# Patient Record
Sex: Female | Born: 1990 | Race: Black or African American | Hispanic: No | Marital: Single | Smoking: Never smoker
Health system: Southern US, Community
[De-identification: ages and names within clinical notes are randomized; demographics above are authoritative.]

## PROBLEM LIST (undated history)

## (undated) DIAGNOSIS — F419 Anxiety disorder, unspecified: Secondary | ICD-10-CM

---

## 2021-05-13 ENCOUNTER — Other Ambulatory Visit (HOSPITAL_COMMUNITY): Payer: Self-pay

## 2021-05-13 ENCOUNTER — Telehealth: Payer: 59 | Admitting: Physician Assistant

## 2021-05-13 DIAGNOSIS — H04203 Unspecified epiphora, bilateral lacrimal glands: Secondary | ICD-10-CM | POA: Diagnosis not present

## 2021-05-13 MED ORDER — AZELASTINE HCL 0.05 % OP SOLN
1.0000 [drp] | Freq: Two times a day (BID) | OPHTHALMIC | 0 refills | Status: DC
  Filled 2021-05-13: qty 6, 28d supply, fill #0

## 2021-05-13 NOTE — Progress Notes (Signed)
I have spent 5 minutes in review of e-visit questionnaire, review and updating patient chart, medical decision making and response to patient.   Leean Amezcua Cody Guida Asman, PA-C    

## 2021-05-13 NOTE — Progress Notes (Signed)
E visit for Allergic Rhinitis We are sorry that you are not feeling well.  Here is how we plan to help!  Based on what you have shared with me it looks like you have Allergic induced tearing or allergic conjunctivitis.  I recommend the following over the counter treatments: You should take a daily dose of antihistamine and Clarinex 5 mg take 1 tablet daily (NOTE: Do not take if pregnant or breastfeeding) I have also sent in a script for Optivar eye solution to use as directed. If no substantial improvement within 3-4 days of using this, you need an in-person evaluation with a good eye examination.    HOME CARE:  You can use an over-the-counter saline nasal spray as needed Avoid areas where there is heavy dust, mites, or molds Stay indoors on windy days during the pollen season Keep windows closed in home, at least in bedroom; use air conditioner. Use high-efficiency house air filter Keep windows closed in car, turn AC on re-circulate Avoid playing out with dog during pollen season  GET HELP RIGHT AWAY IF:  If your symptoms do not improve within 10 days You become short of breath You develop yellow or green discharge from your nose for over 3 days You have coughing fits  MAKE SURE YOU:  Understand these instructions Will watch your condition Will get help right away if you are not doing well or get worse  Thank you for choosing an e-visit. Your e-visit answers were reviewed by a board certified advanced clinical practitioner to complete your personal care plan. Depending upon the condition, your plan could have included both over the counter or prescription medications. Please review your pharmacy choice. Be sure that the pharmacy you have chosen is open so that you can pick up your prescription now.  If there is a problem you may message your provider in MyChart to have the prescription routed to another pharmacy. Your safety is important to [place]. If you have drug allergies check  your prescription carefully.  For the next 24 hours, you can use MyChart to ask questions about today's visit, request a non-urgent call back, or ask for a work or school excuse from your e-visit provider. You will get an email in the next two days asking about your experience. I hope that your e-visit has been valuable and will speed your recovery.

## 2021-05-14 ENCOUNTER — Other Ambulatory Visit (HOSPITAL_COMMUNITY): Payer: Self-pay

## 2021-05-15 ENCOUNTER — Other Ambulatory Visit (HOSPITAL_COMMUNITY): Payer: Self-pay

## 2021-06-24 ENCOUNTER — Ambulatory Visit: Payer: Self-pay | Admitting: Medical

## 2021-07-23 ENCOUNTER — Other Ambulatory Visit (HOSPITAL_COMMUNITY): Payer: Self-pay

## 2021-07-23 ENCOUNTER — Encounter: Payer: Self-pay | Admitting: Nurse Practitioner

## 2021-07-23 ENCOUNTER — Ambulatory Visit: Payer: 59 | Admitting: Nurse Practitioner

## 2021-07-23 ENCOUNTER — Other Ambulatory Visit: Payer: Self-pay

## 2021-07-23 VITALS — BP 112/78 | HR 91 | Temp 97.6°F | Ht 67.75 in | Wt 246.6 lb

## 2021-07-23 DIAGNOSIS — E669 Obesity, unspecified: Secondary | ICD-10-CM | POA: Insufficient documentation

## 2021-07-23 DIAGNOSIS — F321 Major depressive disorder, single episode, moderate: Secondary | ICD-10-CM | POA: Diagnosis not present

## 2021-07-23 DIAGNOSIS — F339 Major depressive disorder, recurrent, unspecified: Secondary | ICD-10-CM | POA: Insufficient documentation

## 2021-07-23 DIAGNOSIS — Z6837 Body mass index (BMI) 37.0-37.9, adult: Secondary | ICD-10-CM | POA: Diagnosis not present

## 2021-07-23 DIAGNOSIS — H04123 Dry eye syndrome of bilateral lacrimal glands: Secondary | ICD-10-CM

## 2021-07-23 DIAGNOSIS — F5081 Binge eating disorder: Secondary | ICD-10-CM | POA: Diagnosis not present

## 2021-07-23 DIAGNOSIS — E663 Overweight: Secondary | ICD-10-CM | POA: Insufficient documentation

## 2021-07-23 MED ORDER — SYSTANE BALANCE 0.6 % OP SOLN: 1.0000 [drp] | OPHTHALMIC | Status: AC | PRN

## 2021-07-23 MED ORDER — BUPROPION HCL 75 MG PO TABS
75.0000 mg | ORAL_TABLET | Freq: Two times a day (BID) | ORAL | 5 refills | Status: DC
  Filled 2021-07-23: qty 60, 30d supply, fill #0
  Filled 2021-08-20: qty 60, 30d supply, fill #1

## 2021-07-23 NOTE — Patient Instructions (Addendum)
Thank you for choosing [facility]  Go to lab for blood day  Sign medical release form to get your records from previous GYN  You will be contacted to schedule appt with psychologist, weight management clinic and ophthalmology.  [email or web address]"> [email or web address]"> [email or web address]/"> http://point-[email or web address]-care.[email or web address]">  Managing Depression, Adult Depression is a mental health condition that affects your thoughts, feelings, and actions. Being diagnosed with depression can bring you relief if you did not know why you have felt or behaved a certain way. It could also leave you feeling overwhelmed with uncertainty about your future. Preparing yourself tomanage your symptoms can help you feel more positive about your future. How to manage lifestyle changes Managing stress  Stress is your body's reaction to life changes and events, both good and bad. Stress can add to your feelings of depression. Learning to manage your stresscan help lessen your feelings of depression. Try some of the following approaches to reducing your stress (stress reduction techniques): Listen to music that you enjoy and that inspires you. Try using a meditation app or take a meditation class. Develop a practice that helps you connect with your spiritual self. Walk in nature, pray, or go to a place of worship. Do some deep breathing. To do this, inhale slowly through your nose. Pause at the top of your inhale for a few seconds and then exhale slowly, letting your muscles relax. Practice yoga to help relax and work your muscles. Choose a stress reduction technique that suits your lifestyle and personality. These techniques take time and practice to develop. Set aside 5-15 minutes a day to do them. Therapists can offer training in these techniques. Other things you can do to manage stress include: Keeping a stress diary. Knowing  your limits and saying no when you think something is too much. Paying attention to how you react to certain situations. You may not be able to control everything, but you can change your reaction. Adding humor to your life by watching funny films or TV shows. Making time for activities that you enjoy and that relax you.  Medicines Medicines, such as antidepressants, are often a part of treatment for depression. Talk with your pharmacist or health care provider about all the medicines, supplements, and herbal products that you take, their possible side effects, and what medicines and other products are safe to take together. Make sure to report any side effects you may have to your health care provider. Relationships Your health care provider may suggest family therapy, couples therapy, orindividual therapy as part of your treatment. How to recognize changes Everyone responds differently to treatment for depression. As you recover from depression, you may start to: Have more interest in doing activities. Feel less hopeless. Have more energy. Overeat less often, or have a better appetite. Have better mental focus. It is important to recognize if your depression is not getting better or is getting worse. The symptoms you had in the beginning may return, such as: Tiredness (fatigue) or low energy. Eating too much or too little. Sleeping too much or too little. Feeling restless, agitated, or hopeless. Trouble focusing or making decisions. Unexplained physical complaints. Feeling irritable, angry, or aggressive. If you or your family members notice these symptoms coming back, let yourhealth care provider know right away. Follow these instructions at home: Activity  Try to get some form of exercise each day, such as walking, biking, swimming, or lifting weights. Practice stress reduction techniques. Engage your mind by taking a class or doing some volunteer work.  Lifestyle Get the right  amount and quality of sleep. Cut down on using caffeine, tobacco, alcohol, and other potentially harmful substances. Eat a healthy diet that includes plenty of vegetables, fruits, whole grains, low-fat dairy products, and lean protein. Do not eat a lot of foods that are high in solid fats, added sugars, or salt (sodium). General instructions Take over-the-counter and prescription medicines only as told by your health care provider. Keep all follow-up visits as told by your health care provider. This is important. Where to find support Talking to others  Friends and family members can be sources of support and guidance. Talk to trusted friends or family members about your condition. Explain your symptoms to them, and let them know that you are working with a health care provider to treat your depression. Tell friends and family members how they also can behelpful. Finances Find appropriate mental health providers that fit with your financial situation. Talk with your health care provider about options to get reduced prices on your medicines. Where to find more information You can find support in your area from: Anxiety and Depression Association of America (ADAA): www.adaa.org Mental Health America: www.mentalhealthamerica.net The [employer] on Mental Illness: www.nami.org Contact a health care provider if: You stop taking your antidepressant medicines, and you have any of these symptoms: Nausea. Headache. Light-headedness. Chills and body aches. Not being able to sleep (insomnia). You or your friends and family think your depression is getting worse. Get help right away if: You have thoughts of hurting yourself or others. If you ever feel like you may hurt yourself or others, or have thoughts about taking your own life, get help right away. Go to your nearest emergency department or: Call your local emergency services (911 in the [place] Call a suicide crisis helpline, such as the  National Suicide Prevention Lifeline at 902-578-1023. This is open 24 hours a day in the [place] Text the Crisis Text Line at 367-522-4911 (in the [place] Summary If you are diagnosed with depression, preparing yourself to manage your symptoms is a good way to feel positive about your future. Work with your health care provider on a management plan that includes stress reduction techniques, medicines (if applicable), therapy, and healthy lifestyle habits. Keep talking with your health care provider about how your treatment is working. If you have thoughts about taking your own life, call a suicide crisis helpline or text a crisis text line. This information is not intended to replace advice given to you by your health care provider. Make sure you discuss any questions you have with your healthcare provider. Document Revised: 09/26/2019 Document Reviewed: 09/26/2019 Elsevier Patient Education  2022 [employer].

## 2021-07-23 NOTE — Assessment & Plan Note (Signed)
Weight loss goal:60lbs Lowest weight:155lbs at 30yrs old Current weight is heaviest. To B12 injections and phentermine 7yrs ago and lost 20lbs. Exercise: cardio exercise daily Diet: has difficulty maintaining healthy diet and small portions

## 2021-07-23 NOTE — Assessment & Plan Note (Signed)
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 Reports stress eating especially at night. Current support system: her parents No previous medication or therapy sessions. No previous IVC, no suicide attept, no current SI/HI, no ETOH use, no illicit drug use/  Agreed to stating wellbutrin and referral to psychologist. F/up in 57month

## 2021-07-23 NOTE — Progress Notes (Signed)
Y   Subjective:  Patient ID: Maria Wiggins, female    DOB: Dec 25, 1990  Age: 30 y.o. MRN: [id number]  CC: Establish Care (New patient/Pt would like to discuss weight loss options and dry eyes. Pt states since moving here from [place] she has experienced dry eye and she has been using drops with no relief. )  Eye Problem  Both eyes are affected. This is a new problem. The current episode started 1 to 4 weeks ago. The problem occurs constantly. The problem has been unchanged. The injury mechanism is unknown. There is No known exposure to pink eye. She Does not wear contacts. Associated symptoms include a foreign body sensation and itching. Pertinent negatives include no blurred vision, eye discharge, double vision, eye redness, fever, nausea, photophobia, recent URI or vomiting. She has tried eye drops for the symptoms. The treatment provided mild relief.   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 Reports stress eating especially at night. Current support system: her parents No previous medication or therapy sessions. No previous IVC, no suicide attept, no current SI/HI, no ETOH use, no illicit drug use/  Agreed to stating wellbutrin and referral to psychologist. F/up in 2month  Depression screen [facility] 2/9 07/23/2021  Decreased Interest 2  Down, Depressed, Hopeless 1  PHQ - 2 Score 3  Altered sleeping 2  Tired, decreased energy 2  Change in appetite 3  Feeling bad or failure about yourself  2  Trouble concentrating 1  Moving slowly or fidgety/restless 2  Suicidal thoughts 0  PHQ-9 Score 15  Difficult doing work/chores Somewhat difficult    GAD 7 : Generalized Anxiety Score 07/23/2021  Nervous, Anxious, on Edge 2  Control/stop worrying 3  Worry too much - different things 3  Trouble relaxing 2  Restless 2  Easily annoyed or irritable 2   Afraid - awful might happen 2  Total GAD 7 Score 16  Anxiety Difficulty Somewhat difficult   Reviewed past Medical, Social and Family history today.  Outpatient Medications Prior to Visit  Medication Sig Dispense Refill   Omega-3 Fatty Acids (FISH OIL PO) Take by mouth.     VIENVA 0.1-20 MG-MCG tablet Take by mouth.     azelastine (OPTIVAR) 0.05 % ophthalmic solution Place 1 drop into both eyes 2 (two) times daily. (Patient not taking: Reported on 07/23/2021) 6 mL 0   No facility-administered medications prior to visit.   ROS See HPI  Objective:  BP 112/78 (BP Location: Left Arm, Patient Position: Sitting, Cuff Size: Large)   Pulse 91   Temp 97.6 F (36.4 C) (Temporal)   Ht 5' 7.75" (1.721 m)   Wt 246 lb 9.6 oz (111.9 kg)   SpO2 97%   BMI 37.77 kg/m   Physical Exam Eyes:     General: Lids are normal. No allergic shiner or scleral icterus.       Right eye: No foreign body, discharge or hordeolum.        Left eye: No foreign body, discharge or hordeolum.     Extraocular Movements: Extraocular movements intact.     Conjunctiva/sclera: Conjunctivae normal.  Neurological:     Mental Status: She is oriented to person, place, and time.  Psychiatric:        Attention and Perception: Attention normal.        Mood and Affect: Affect is tearful.  Speech: Speech normal.        Behavior: Behavior is cooperative.        Thought Content: Thought content normal.        Cognition and Memory: Cognition normal.        Judgment: Judgment normal.    Assessment & Plan:  This visit occurred during the SARS-CoV-2 public health emergency.  Safety protocols were in place, including screening questions prior to the visit, additional usage of staff PPE, and extensive cleaning of exam room while observing appropriate contact time as indicated for disinfecting solutions.   Maria Wiggins was seen today for establish care.  Diagnoses and all orders for this visit:  Depression, major, single  episode, moderate (HCC) -     buPROPion (WELLBUTRIN) 75 MG tablet; Take 1 tablet by mouth 2 times daily. -     Ambulatory referral to Psychology -     TSH  Dry eyes, bilateral -     Ambulatory referral to Ophthalmology -     Propylene Glycol (SYSTANE BALANCE) 0.6 % SOLN; Apply 1 drop to eye as needed.  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 Reports stress eating especially at night. Current support system: her parents No previous medication or therapy sessions. No previous IVC, no suicide attept, no current SI/HI, no ETOH use, no illicit drug use/  Agreed to stating wellbutrin and referral to psychologist. F/up in 24month      Relevant Medications   buPROPion (WELLBUTRIN) 75 MG tablet   Other Relevant Orders   Ambulatory referral to Psychology   TSH   Other Visit Diagnoses     Dry eyes, bilateral       Relevant Medications   Propylene Glycol (SYSTANE BALANCE) 0.6 % SOLN   Other Relevant Orders   Ambulatory referral to Ophthalmology   Class 2 severe obesity due to excess calories with serious comorbidity and body mass index (BMI) of 37.0 to 37.9 in adult [facility])       Relevant Orders   Comprehensive metabolic panel   Hemoglobin A1c   Proinsulin/insulin ratio   Amb Ref to Medical Weight Management   TSH   Recurrent binge eating           Follow-up: Return in about 4 weeks (around 08/20/2021) for depression and weight management.  Alysia Penna,  NP

## 2021-07-24 ENCOUNTER — Other Ambulatory Visit (INDEPENDENT_AMBULATORY_CARE_PROVIDER_SITE_OTHER): Payer: 59

## 2021-07-24 DIAGNOSIS — F321 Major depressive disorder, single episode, moderate: Secondary | ICD-10-CM | POA: Diagnosis not present

## 2021-07-24 LAB — COMPREHENSIVE METABOLIC PANEL
ALT: 23 U/L (ref 0–35)
AST: 22 U/L (ref 0–37)
Albumin: 4.3 g/dL (ref 3.5–5.2)
Alkaline Phosphatase: 53 U/L (ref 39–117)
BUN: 12 mg/dL (ref 6–23)
CO2: 23 mEq/L (ref 19–32)
Calcium: 9.9 mg/dL (ref 8.4–10.5)
Chloride: 102 mEq/L (ref 96–112)
Creatinine, Ser: 0.95 mg/dL (ref 0.40–1.20)
GFR: 79.28 mL/min (ref >60.00)
Glucose, Bld: 81 mg/dL (ref 70–99)
Potassium: 4 mEq/L (ref 3.5–5.1)
Sodium: 136 mEq/L (ref 135–145)
Total Bilirubin: 0.3 mg/dL (ref 0.2–1.2)
Total Protein: 7.3 g/dL (ref 6.0–8.3)

## 2021-07-24 LAB — TSH: TSH: 2.18 u[IU]/mL (ref 0.35–5.50)

## 2021-07-24 LAB — HEMOGLOBIN A1C: Hgb A1c MFr Bld: 5.9 % (ref 4.6–6.5)

## 2021-07-31 LAB — PROINSULIN/INSULIN RATIO
Insulin: 21 u[IU]/mL — ABNORMAL HIGH
Proinsulin: 10.2 pmol/L — ABNORMAL HIGH

## 2021-08-01 ENCOUNTER — Encounter: Payer: Self-pay | Admitting: Nurse Practitioner

## 2021-08-01 DIAGNOSIS — R7303 Prediabetes: Secondary | ICD-10-CM

## 2021-08-02 ENCOUNTER — Emergency Department (HOSPITAL_BASED_OUTPATIENT_CLINIC_OR_DEPARTMENT_OTHER): Payer: 59

## 2021-08-02 ENCOUNTER — Emergency Department (HOSPITAL_BASED_OUTPATIENT_CLINIC_OR_DEPARTMENT_OTHER)
Admission: EM | Admit: 2021-08-02 | Discharge: 2021-08-03 | Disposition: A | Discharge status: Home / Self Care | Payer: 59 | Attending: Emergency Medicine | Admitting: Emergency Medicine

## 2021-08-02 ENCOUNTER — Encounter (HOSPITAL_BASED_OUTPATIENT_CLINIC_OR_DEPARTMENT_OTHER): Payer: Self-pay | Admitting: Emergency Medicine

## 2021-08-02 DIAGNOSIS — R079 Chest pain, unspecified: Secondary | ICD-10-CM | POA: Diagnosis not present

## 2021-08-02 DIAGNOSIS — R0789 Other chest pain: Secondary | ICD-10-CM | POA: Insufficient documentation

## 2021-08-02 LAB — BASIC METABOLIC PANEL
Anion gap: 6 (ref 5–15)
BUN: 14 mg/dL (ref 6–20)
CO2: 23 mmol/L (ref 22–32)
Calcium: 9.2 mg/dL (ref 8.9–10.3)
Chloride: 106 mmol/L (ref 98–111)
Creatinine, Ser: 1.05 mg/dL — ABNORMAL HIGH (ref 0.44–1.00)
GFR, Estimated: >60 mL/min (ref >60)
Glucose, Bld: 116 mg/dL — ABNORMAL HIGH (ref 70–99)
Potassium: 3.8 mmol/L (ref 3.5–5.1)
Sodium: 135 mmol/L (ref 135–145)

## 2021-08-02 LAB — CBC
HCT: 40.4 % (ref 36.0–46.0)
Hemoglobin: 13.6 g/dL (ref 12.0–15.0)
MCH: 28.7 pg (ref 26.0–34.0)
MCHC: 33.7 g/dL (ref 30.0–36.0)
MCV: 85.2 fL (ref 80.0–100.0)
Platelets: 353 10*3/uL (ref 150–400)
RBC: 4.74 MIL/uL (ref 3.87–5.11)
RDW: 12.2 % (ref 11.5–15.5)
WBC: 10.4 10*3/uL (ref 4.0–10.5)
nRBC: 0 % (ref 0.0–0.2)

## 2021-08-02 LAB — TROPONIN I (HIGH SENSITIVITY): Troponin I (High Sensitivity): 2 ng/L (ref <18)

## 2021-08-02 NOTE — ED Triage Notes (Addendum)
Pt reports CP that started at 2000, mid- LT chest w/ some radiation to LT shoulder/arm; reports pain worse when lying down and laughing; took ibuprofen 2030; reports she started a new med- Buproprion- 8/26

## 2021-08-03 DIAGNOSIS — R0789 Other chest pain: Secondary | ICD-10-CM | POA: Diagnosis not present

## 2021-08-03 LAB — PREGNANCY, URINE: Preg Test, Ur: NEGATIVE

## 2021-08-03 NOTE — ED Provider Notes (Addendum)
[facility] Provider Note: Lowella Dell, MD, FACEP  CSN: [id number] MRN: [id number] ARRIVAL: 08/02/21 at 2233 ROOM: MH11/MH11   CHIEF COMPLAINT  Chest Pain   HISTORY OF PRESENT ILLNESS  08/03/21 1:00 AM Maria Wiggins is a 30 y.o. female who was lying in bed about 8 PM yesterday evening when she developed chest pain.  The pain was in her left upper chest radiating to her left shoulder.  She rates her pain as a 7 out of 10 at its worst.  There was a constant, dull component as well as a brief sharp, stabbing component.  She had equivocal worsening with movement of the left shoulder but not the neck.  It was not worse with deep breathing.  She had no shortness of breath, diaphoresis or nausea with this.  The pain has subsequently improved after taking ibuprofen and she rates it as a 2 out of 10 now.   History reviewed. No pertinent past medical history.  History reviewed. No pertinent surgical history.  No family history on file.  Social History   Tobacco Use   Smoking status: Never   Smokeless tobacco: Never  Vaping Use   Vaping Use: Never used  Substance Use Topics   Alcohol use: Not Currently   Drug use: Never    Prior to Admission medications   Medication Sig Start Date End Date Taking? Authorizing Provider  buPROPion (WELLBUTRIN) 75 MG tablet Take 1 tablet by mouth 2 times daily. 07/23/21   Nche, Bonna Gains, NP  Omega-3 Fatty Acids (FISH OIL PO) Take by mouth.    [provider]  Propylene Glycol (SYSTANE BALANCE) 0.6 % SOLN Apply 1 drop to eye as needed. 07/23/21   Nche, Bonna Gains, NP  VIENVA 0.1-20 MG-MCG tablet Take by mouth. 05/16/21   [provider]    Allergies Patient has no known allergies.   REVIEW OF SYSTEMS  Negative except as noted here or in the History of Present Illness.   PHYSICAL EXAMINATION  Initial Vital Signs Blood pressure (!) 145/90, pulse 87, temperature 98.6 F (37 C), temperature source Oral, resp.  rate 18, height 5\' 10"  (1.778 m), weight 111.6 kg, SpO2 100 %.  Examination General: Well-developed, high BMI female in no acute distress; appearance consistent with age of record HENT: normocephalic; atraumatic Eyes: pupils equal, round and reactive to light; extraocular muscles intact Neck: supple; no exacerbation of pain with range of motion Heart: regular rate and rhythm Lungs: clear to auscultation bilaterally Abdomen: soft; nondistended; nontender; bowel sounds present Extremities: No deformity; full range of motion; pulses normal; no exacerbation of pain with left shoulder range of motion Neurologic: Awake, alert and oriented; motor function intact in all extremities and symmetric; no facial droop Skin: Warm and dry Psychiatric: Normal mood and affect   RESULTS  Summary of this visit's results, reviewed and interpreted by myself:   EKG Interpretation  Date/Time:  Sunday August 02 2021 22:40:41 EDT Ventricular Rate:  95 PR Interval:  130 QRS Duration: 88 QT Interval:  334 QTC Calculation: 419 R Axis:   70 Text Interpretation: Normal sinus rhythm Normal ECG No previous ECGs available Confirmed by Lilyahna Sirmon (03-06-1980) on 08/02/2021 11:51:53 PM       Laboratory Studies: Results for orders placed or performed during the hospital encounter of 08/02/21 (from the past 24 hour(s))  Basic metabolic panel     Status: Abnormal   Collection Time: 08/02/21 11:00 PM  Result Value Ref Range   Sodium 135  135 - 145 mmol/L   Potassium 3.8 3.5 - 5.1 mmol/L   Chloride 106 98 - 111 mmol/L   CO2 23 22 - 32 mmol/L   Glucose, Bld 116 (H) 70 - 99 mg/dL   BUN 14 6 - 20 mg/dL   Creatinine, Ser 5.95 (H) 0.44 - 1.00 mg/dL   Calcium 9.2 8.9 - 63.8 mg/dL   GFR, Estimated >75 >64 mL/min   Anion gap 6 5 - 15  CBC     Status: None   Collection Time: 08/02/21 11:00 PM  Result Value Ref Range   WBC 10.4 4.0 - 10.5 K/uL   RBC 4.74 3.87 - 5.11 MIL/uL   Hemoglobin 13.6 12.0 - 15.0 g/dL   HCT  33.2 95.1 - 88.4 %   MCV 85.2 80.0 - 100.0 fL   MCH 28.7 26.0 - 34.0 pg   MCHC 33.7 30.0 - 36.0 g/dL   RDW 16.6 06.3 - 01.6 %   Platelets 353 150 - 400 K/uL   nRBC 0.0 0.0 - 0.2 %  Troponin I (High Sensitivity)     Status: None   Collection Time: 08/02/21 11:00 PM  Result Value Ref Range   Troponin I (High Sensitivity) 2 <18 ng/L  Pregnancy, urine     Status: None   Collection Time: 08/03/21 12:08 AM  Result Value Ref Range   Preg Test, Ur NEGATIVE NEGATIVE   Imaging Studies: DG Chest 2 View  Result Date: 08/02/2021 CLINICAL DATA:  Chest pain EXAM: CHEST - 2 VIEW COMPARISON:  None. FINDINGS: The heart size and mediastinal contours are within normal limits. No focal consolidation. No pleural effusion. No pneumothorax. The visualized skeletal structures are unremarkable. IMPRESSION: No active cardiopulmonary disease. Electronically Signed   By: Maudry Mayhew M.D.   On: 08/02/2021 23:07    ED COURSE and MDM  Nursing notes, initial and subsequent vitals signs, including pulse oximetry, reviewed and interpreted by myself.  Vitals:   08/02/21 2240 08/02/21 2249 08/03/21 0024  BP:  136/86 (!) 145/90  Pulse:  93 87  Resp:  20 18  Temp:  98.6 F (37 C)   TempSrc:  Oral   SpO2:  99% 100%  Weight: 111.6 kg    Height: 5\' 10"  (1.778 m)     Medications - No data to display  HEART score 2: Patient does not wish to stay for a second troponin and her first troponin was at the low end of normal after more than 3 hours from the onset of pain.  Her pain was atypical and I have a low suspicion of cardiac etiology.  PROCEDURES  Procedures   ED DIAGNOSES     ICD-10-CM   1. Atypical chest pain  R07.89          02-06-1990, MD 08/03/21 0115    10/03/21, MD 08/03/21 [phone]

## 2021-08-05 ENCOUNTER — Other Ambulatory Visit (HOSPITAL_COMMUNITY): Payer: Self-pay

## 2021-08-05 ENCOUNTER — Other Ambulatory Visit: Payer: Self-pay | Admitting: Nurse Practitioner

## 2021-08-05 DIAGNOSIS — R7303 Prediabetes: Secondary | ICD-10-CM

## 2021-08-05 DIAGNOSIS — Z6837 Body mass index (BMI) 37.0-37.9, adult: Secondary | ICD-10-CM

## 2021-08-05 MED ORDER — SAXENDA 18 MG/3ML ~~LOC~~ SOPN
0.6000 mg | PEN_INJECTOR | Freq: Every day | SUBCUTANEOUS | 2 refills | Status: DC
  Filled 2021-08-05: qty 3, 30d supply, fill #0

## 2021-08-06 ENCOUNTER — Telehealth: Payer: Self-pay

## 2021-08-06 ENCOUNTER — Other Ambulatory Visit (HOSPITAL_COMMUNITY): Payer: Self-pay

## 2021-08-06 MED ORDER — SAXENDA 18 MG/3ML ~~LOC~~ SOPN
PEN_INJECTOR | SUBCUTANEOUS | 0 refills | Status: DC
  Filled 2021-08-06: qty 15, 90d supply, fill #0
  Filled 2021-08-07 – 2021-08-20 (×5): qty 15, 30d supply, fill #0

## 2021-08-06 MED ORDER — SAXENDA 18 MG/3ML ~~LOC~~ SOPN
0.6000 mg | PEN_INJECTOR | Freq: Every day | SUBCUTANEOUS | 0 refills | Status: DC
  Filled 2021-08-06: qty 3, 30d supply, fill #0

## 2021-08-06 NOTE — Telephone Encounter (Signed)
Pharmacy called and states changing the medication Saxenda directions would allow the patient to receive the whole box instead of just one pin and the medication could be titrated to the doses as she comes in for visits eliminating multiple prior authorizations. Are you okay with this?

## 2021-08-06 NOTE — Addendum Note (Signed)
Addended by: Alysia Penna L on: 08/06/2021 10:25 AM   Modules accepted: Orders

## 2021-08-06 NOTE — Telephone Encounter (Signed)
Noted. Dm/cma  

## 2021-08-07 ENCOUNTER — Telehealth: Payer: Self-pay

## 2021-08-07 ENCOUNTER — Other Ambulatory Visit (HOSPITAL_COMMUNITY): Payer: Self-pay

## 2021-08-07 NOTE — Telephone Encounter (Signed)
PA for Saxenda submitted through cover my meds to [employer].  Awaiting on response. Dm/cma   Key: [id number]

## 2021-08-10 ENCOUNTER — Other Ambulatory Visit (HOSPITAL_COMMUNITY): Payer: Self-pay

## 2021-08-11 ENCOUNTER — Telehealth: Payer: Self-pay | Admitting: Nurse Practitioner

## 2021-08-11 ENCOUNTER — Other Ambulatory Visit (HOSPITAL_COMMUNITY): Payer: Self-pay

## 2021-08-11 NOTE — Telephone Encounter (Signed)
Pt is wondering if there are any alternatives to her weight loss issues other than injectables. This is to expensive for her to be able to afford. Please advise pt at [phone] work phone # or [phone] her cell #.

## 2021-08-13 NOTE — Telephone Encounter (Signed)
Please review and advise. Thanks. Dm/cma  

## 2021-08-14 NOTE — Telephone Encounter (Signed)
Lft VM regarding not having any alternatives for weight loss. Advised to call back with any questions. Dm/cma

## 2021-08-20 ENCOUNTER — Other Ambulatory Visit (HOSPITAL_COMMUNITY): Payer: Self-pay

## 2021-08-20 MED ORDER — INSULIN PEN NEEDLE 31G X 6 MM MISC
0 refills | Status: DC
  Filled 2021-08-20: qty 100, 90d supply, fill #0

## 2021-09-08 ENCOUNTER — Other Ambulatory Visit (HOSPITAL_COMMUNITY): Payer: Self-pay

## 2021-09-08 ENCOUNTER — Encounter: Payer: Self-pay | Admitting: Nurse Practitioner

## 2021-09-08 ENCOUNTER — Other Ambulatory Visit: Payer: Self-pay

## 2021-09-08 ENCOUNTER — Ambulatory Visit: Payer: 59 | Admitting: Nurse Practitioner

## 2021-09-08 VITALS — BP 124/70 | HR 107 | Temp 97.5°F | Ht 70.0 in | Wt 238.6 lb

## 2021-09-08 DIAGNOSIS — Z6837 Body mass index (BMI) 37.0-37.9, adult: Secondary | ICD-10-CM | POA: Diagnosis not present

## 2021-09-08 DIAGNOSIS — F321 Major depressive disorder, single episode, moderate: Secondary | ICD-10-CM

## 2021-09-08 DIAGNOSIS — Z23 Encounter for immunization: Secondary | ICD-10-CM | POA: Diagnosis not present

## 2021-09-08 MED ORDER — BUPROPION HCL 75 MG PO TABS
75.0000 mg | ORAL_TABLET | Freq: Two times a day (BID) | ORAL | 3 refills | Status: DC
  Filled 2021-09-08 – 2021-09-21 (×2): qty 180, 90d supply, fill #0

## 2021-09-08 MED ORDER — SAXENDA 18 MG/3ML ~~LOC~~ SOPN
PEN_INJECTOR | SUBCUTANEOUS | 1 refills | Status: DC
  Filled 2021-09-08: qty 15, fill #0
  Filled 2021-09-21: qty 15, 30d supply, fill #0
  Filled 2021-10-13: qty 15, 30d supply, fill #1

## 2021-09-08 NOTE — Patient Instructions (Signed)
Maintain 1.8mg  dose of saxenda x 2weeks prior to increasing to 2.4mg  daily x 2weeks, then f/up with me. Maintain wellbutrin dose.

## 2021-09-08 NOTE — Progress Notes (Signed)
Subjective:  Patient ID: Maria Wiggins, female    DOB: November 24, 1991  Age: 30 y.o. MRN: [id number]  CC: Follow-up (Pt would like to follow up on weight loss, states she feels medication is working. )  HPI  Depression, major, single episode, moderate (HCC) Improved mood with wellbutrin 75mg  BID Denies any adverse side effects.  Maintain current dose F/up in 27month  Class 2 severe obesity due to excess calories with serious comorbidity and body mass index (BMI) of 37.0 to 37.9 in adult [facility]) Lost 8lbs in last 4weeks. Current use of saxenda 1.8mg  daily x  Denies any adverse side effects. Lost 8Lbs in last 3weeks. She has also incorporated small meal portions through meal preps, and exercising daily.  Advised to maintain 1.8mg  dose x 2weeks, then increase to 2.4mg  daily x 2weeks F/up in 66month  Wt Readings from Last 3 Encounters:  09/08/21 238 lb 9.6 oz (108.2 kg)  08/02/21 246 lb (111.6 kg)  07/23/21 246 lb 9.6 oz (111.9 kg)    Reviewed past Medical, Social and Family history today.  Outpatient Medications Prior to Visit  Medication Sig Dispense Refill   Insulin Pen Needle 31G X 6 MM MISC Use as directed with Saxenda 100 each 0   Omega-3 Fatty Acids (FISH OIL PO) Take by mouth.     Propylene Glycol (SYSTANE BALANCE) 0.6 % SOLN Apply 1 drop to eye as needed.     VIENVA 0.1-20 MG-MCG tablet Take by mouth.     buPROPion (WELLBUTRIN) 75 MG tablet Take 1 tablet by mouth 2 times daily. 60 tablet 5   Liraglutide -Weight Management (SAXENDA) 18 MG/3ML SOPN Inject 0.6 mg into the skin daily. 15 mL 0   Liraglutide -Weight Management (SAXENDA) 18 MG/3ML SOPN Inject into the skin 0.6 MG daily then titrate up until 3 MG dose is reached 15 mL 0   No facility-administered medications prior to visit.    ROS See HPI  Objective:  BP 124/70 (BP Location: Left Arm, Patient Position: Sitting, Cuff Size: Large)   Pulse (!) 107   Temp (!) 97.5 F (36.4 C) (Temporal)   Ht 5\' 10"  (1.778 m)    Wt 238 lb 9.6 oz (108.2 kg)   SpO2 99%   BMI 34.24 kg/m   Physical Exam Vitals reviewed.  Constitutional:      Appearance: She is obese.  Cardiovascular:     Rate and Rhythm: Normal rate.     Pulses: Normal pulses.  Pulmonary:     Effort: Pulmonary effort is normal.  Abdominal:     General: Bowel sounds are normal.     Palpations: Abdomen is soft.     Tenderness: There is no abdominal tenderness. There is no guarding.  Musculoskeletal:     Right lower leg: No edema.     Left lower leg: No edema.  Neurological:     Mental Status: She is alert and oriented to person, place, and time.  Psychiatric:        Mood and Affect: Mood normal.        Behavior: Behavior normal.        Thought Content: Thought content normal.   Assessment & Plan:  This visit occurred during the SARS-CoV-2 public health emergency.  Safety protocols were in place, including screening questions prior to the visit, additional usage of staff PPE, and extensive cleaning of exam room while observing appropriate contact time as indicated for disinfecting solutions.   Maria Wiggins was seen today for follow-up.  Diagnoses  and all orders for this visit:  Class 2 severe obesity due to excess calories with serious comorbidity and body mass index (BMI) of 37.0 to 37.9 in adult (HCC) -     Liraglutide -Weight Management (SAXENDA) 18 MG/3ML SOPN; Inject into the skin 0.6 MG daily then titrate up until 3 MG dose is reached  Flu vaccine need -     Flu Vaccine QUAD 6+ mos PF IM (Fluarix Quad PF)  Depression, major, single episode, moderate (HCC) -     buPROPion (WELLBUTRIN) 75 MG tablet; Take 1 tablet by mouth 2 times daily.   Problem List Items Addressed This Visit       Other   Class 2 severe obesity due to excess calories with serious comorbidity and body mass index (BMI) of 37.0 to 37.9 in adult [facility]) - Primary    Lost 8lbs in last 4weeks. Current use of saxenda 1.8mg  daily x  Denies any adverse side effects. Lost  8Lbs in last 3weeks. She has also incorporated small meal portions through meal preps, and exercising daily.  Advised to maintain 1.8mg  dose x 2weeks, then increase to 2.4mg  daily x 2weeks F/up in 69month       Relevant Medications   Liraglutide -Weight Management (SAXENDA) 18 MG/3ML SOPN   Depression, major, single episode, moderate (HCC)    Improved mood with wellbutrin 75mg  BID Denies any adverse side effects.  Maintain current dose F/up in 50month      Relevant Medications   buPROPion (WELLBUTRIN) 75 MG tablet   Other Visit Diagnoses     Flu vaccine need       Relevant Orders   Flu Vaccine QUAD 6+ mos PF IM (Fluarix Quad PF)       Follow-up: Return in about 4 weeks (around 10/06/2021) for weight management and depression (fasting for repeat labs).  13/06/2021, NP

## 2021-09-08 NOTE — Assessment & Plan Note (Signed)
Improved mood with wellbutrin 75mg  BID Denies any adverse side effects.  Maintain current dose F/up in 53month

## 2021-09-08 NOTE — Assessment & Plan Note (Addendum)
Lost 8lbs in last 4weeks. Current use of saxenda 1.8mg  daily x  Denies any adverse side effects. Lost 8Lbs in last 3weeks. She has also incorporated small meal portions through meal preps, and exercising daily.  Advised to maintain 1.8mg  dose x 2weeks, then increase to 2.4mg  daily x 2weeks F/up in 79month

## 2021-09-21 ENCOUNTER — Other Ambulatory Visit (HOSPITAL_COMMUNITY): Payer: Self-pay

## 2021-10-13 ENCOUNTER — Other Ambulatory Visit (HOSPITAL_COMMUNITY): Payer: Self-pay

## 2021-10-15 ENCOUNTER — Other Ambulatory Visit (HOSPITAL_COMMUNITY): Payer: Self-pay

## 2021-10-19 ENCOUNTER — Other Ambulatory Visit: Payer: Self-pay

## 2021-10-20 ENCOUNTER — Other Ambulatory Visit (HOSPITAL_COMMUNITY): Payer: Self-pay

## 2021-10-20 ENCOUNTER — Encounter: Payer: Self-pay | Admitting: Nurse Practitioner

## 2021-10-20 ENCOUNTER — Ambulatory Visit: Payer: 59 | Admitting: Nurse Practitioner

## 2021-10-20 VITALS — BP 114/74 | HR 78 | Temp 96.6°F | Ht 70.0 in | Wt 230.6 lb

## 2021-10-20 DIAGNOSIS — Z6833 Body mass index (BMI) 33.0-33.9, adult: Secondary | ICD-10-CM | POA: Diagnosis not present

## 2021-10-20 DIAGNOSIS — F339 Major depressive disorder, recurrent, unspecified: Secondary | ICD-10-CM

## 2021-10-20 DIAGNOSIS — E6609 Other obesity due to excess calories: Secondary | ICD-10-CM | POA: Diagnosis not present

## 2021-10-20 LAB — LIPID PANEL
Cholesterol: 112 mg/dL (ref 0–200)
HDL: 44.1 mg/dL (ref >39.00)
LDL Cholesterol: 59 mg/dL (ref 0–99)
NonHDL: 68.38
Total CHOL/HDL Ratio: 3
Triglycerides: 49 mg/dL (ref 0.0–149.0)
VLDL: 9.8 mg/dL (ref 0.0–40.0)

## 2021-10-20 LAB — COMPREHENSIVE METABOLIC PANEL
ALT: 18 U/L (ref 0–35)
AST: 24 U/L (ref 0–37)
Albumin: 4.4 g/dL (ref 3.5–5.2)
Alkaline Phosphatase: 55 U/L (ref 39–117)
BUN: 7 mg/dL (ref 6–23)
CO2: 22 mEq/L (ref 19–32)
Calcium: 9.2 mg/dL (ref 8.4–10.5)
Chloride: 106 mEq/L (ref 96–112)
Creatinine, Ser: 1.13 mg/dL (ref 0.40–1.20)
GFR: 64.27 mL/min (ref >60.00)
Glucose, Bld: 84 mg/dL (ref 70–99)
Potassium: 4.5 mEq/L (ref 3.5–5.1)
Sodium: 136 mEq/L (ref 135–145)
Total Bilirubin: 0.5 mg/dL (ref 0.2–1.2)
Total Protein: 7.3 g/dL (ref 6.0–8.3)

## 2021-10-20 MED ORDER — SAXENDA 18 MG/3ML ~~LOC~~ SOPN
3.0000 mg | PEN_INJECTOR | Freq: Every day | SUBCUTANEOUS | 2 refills | Status: DC
Start: 2021-10-20 — End: 2022-01-21
  Filled 2021-10-20 – 2021-11-13 (×2): qty 15, 30d supply, fill #0
  Filled 2021-12-28: qty 15, 30d supply, fill #1
  Filled 2022-01-18: qty 15, 30d supply, fill #2

## 2021-10-20 NOTE — Assessment & Plan Note (Addendum)
Current use of saxenda 3mg  daily Denies any adverse side effects Lost 8lbs in last 6weeks Total of 16lbs in last 9months  Maintain saxenda dose Repeat CMP and lipid panel F/up in 72month

## 2021-10-20 NOTE — Progress Notes (Signed)
Subjective:  Patient ID: Maria Wiggins, female    DOB: 06-15-1991  Age: 30 y.o. MRN: [id number]  CC: Follow-up (1 month f/u on weight and depression. )  HPI  Depression, recurrent (HCC) In complete remission with wellbutrin. Maintain current med dose  Obesity Current use of saxenda 3mg  daily Denies any adverse side effects Lost 8lbs in last 6weeks Total of 16lbs in last 70months  Maintain saxenda dose Repeat CMP and lipid panel F/up in 55month  Wt Readings from Last 3 Encounters:  10/20/21 230 lb 9.6 oz (104.6 kg)  09/08/21 238 lb 9.6 oz (108.2 kg)  08/02/21 246 lb (111.6 kg)    BP Readings from Last 3 Encounters:  10/20/21 114/74  09/08/21 124/70  08/03/21 (!) 145/90    Reviewed past Medical, Social and Family history today.  Outpatient Medications Prior to Visit  Medication Sig Dispense Refill   buPROPion (WELLBUTRIN) 75 MG tablet Take 1 tablet by mouth 2 times daily. 180 tablet 3   Insulin Pen Needle 31G X 6 MM MISC Use as directed with Saxenda 100 each 0   Omega-3 Fatty Acids (FISH OIL PO) Take by mouth.     Propylene Glycol (SYSTANE BALANCE) 0.6 % SOLN Apply 1 drop to eye as needed.     VIENVA 0.1-20 MG-MCG tablet Take by mouth.     Liraglutide -Weight Management (SAXENDA) 18 MG/3ML SOPN Inject 0.6 MG into the skin daily then titrate up until 3 MG dose is reached 15 mL 1   No facility-administered medications prior to visit.   ROS See HPI  Objective:  BP 114/74 (BP Location: Left Arm, Patient Position: Sitting, Cuff Size: Large)   Pulse 78   Temp (!) 96.6 F (35.9 C) (Temporal)   Ht 5\' 10"  (1.778 m)   Wt 230 lb 9.6 oz (104.6 kg)   SpO2 99%   BMI 33.09 kg/m   Physical Exam Cardiovascular:     Rate and Rhythm: Normal rate and regular rhythm.     Pulses: Normal pulses.     Heart sounds: Normal heart sounds.  Pulmonary:     Effort: Pulmonary effort is normal.     Breath sounds: Normal breath sounds.  Abdominal:     General: Bowel sounds are normal.      Palpations: Abdomen is soft.  Musculoskeletal:     Cervical back: Normal range of motion and neck supple.     Right lower leg: No edema.     Left lower leg: No edema.  Lymphadenopathy:     Cervical: No cervical adenopathy.  Neurological:     Mental Status: She is oriented to person, place, and time.  Psychiatric:        Mood and Affect: Mood normal.        Behavior: Behavior normal.        Thought Content: Thought content normal.    Assessment & Plan:  This visit occurred during the SARS-CoV-2 public health emergency.  Safety protocols were in place, including screening questions prior to the visit, additional usage of staff PPE, and extensive cleaning of exam room while observing appropriate contact time as indicated for disinfecting solutions.   Maria Wiggins was seen today for follow-up.  Diagnoses and all orders for this visit:  Depression, recurrent (HCC)  Class 1 obesity due to excess calories with serious comorbidity and body mass index (BMI) of 33.0 to 33.9 in adult -     Comprehensive metabolic panel -     Lipid panel -  Liraglutide -Weight Management (SAXENDA) 18 MG/3ML SOPN; Inject 3 mg into the skin daily.   Problem List Items Addressed This Visit       Other   Depression, recurrent (HCC) - Primary    In complete remission with wellbutrin. Maintain current med dose      Obesity    Current use of saxenda 3mg  daily Denies any adverse side effects Lost 8lbs in last 6weeks Total of 16lbs in last 2months  Maintain saxenda dose Repeat CMP and lipid panel F/up in 57month      Relevant Medications   Liraglutide -Weight Management (SAXENDA) 18 MG/3ML SOPN   Other Relevant Orders   Comprehensive metabolic panel   Lipid panel    Follow-up: Return in about 3 months (around 01/20/2022) for CPE and weight management (Fasting).  01/22/2022, NP

## 2021-10-20 NOTE — Assessment & Plan Note (Addendum)
In complete remission with wellbutrin. Maintain current med dose

## 2021-10-27 ENCOUNTER — Other Ambulatory Visit (HOSPITAL_COMMUNITY): Payer: Self-pay

## 2021-10-27 MED ORDER — LEVONORGESTREL-ETHINYL ESTRAD 0.1-20 MG-MCG PO TABS
ORAL_TABLET | ORAL | 1 refills | Status: DC
  Filled 2021-10-27: qty 112, 84d supply, fill #0

## 2021-10-27 MED ORDER — TRETINOIN 0.025 % EX CREA
TOPICAL_CREAM | CUTANEOUS | 1 refills | Status: DC
  Filled 2021-10-27 – 2021-12-28 (×2): qty 20, 30d supply, fill #0

## 2021-11-03 ENCOUNTER — Other Ambulatory Visit (HOSPITAL_COMMUNITY): Payer: Self-pay

## 2021-11-03 ENCOUNTER — Telehealth: Payer: 59 | Admitting: Physician Assistant

## 2021-11-03 DIAGNOSIS — J329 Chronic sinusitis, unspecified: Secondary | ICD-10-CM

## 2021-11-03 DIAGNOSIS — R0689 Other abnormalities of breathing: Secondary | ICD-10-CM

## 2021-11-03 MED ORDER — CARESTART COVID-19 HOME TEST VI KIT
PACK | 0 refills | Status: DC
  Filled 2021-11-03: qty 4, 4d supply, fill #0

## 2021-11-03 NOTE — Progress Notes (Signed)
Based on what you shared with me, I feel your condition warrants further evaluation and I recommend that you be seen in a face to face visit.  Hi Maria Wiggins, I am sorry you are not feeling well. Symptoms seem indicative of a viral sinusitis or other viral respiratory illness. Giving the difficulty with breathing and foggy headedness, you need a further evaluation than just this e-visit. I would recommend either (1) contacting your PCP for evaluation today, (2) Being seen at one of our local urgent cares to get a good exam (see below), or (3) at lease converting to a Virtual Urgent Care visit (do through MyChart just like you set up e-visit) so someone can lay eyes on you, assess breathing, etc to make sure you get appropriate care!   NOTE: There will be NO CHARGE for this eVisit   If you are having a true medical emergency please call 911.      For an urgent face to face visit, [place] has six urgent care centers for your convenience:     [facility]gent [facility] Center at [facility] Directions [phone] [address] [place], [place] [address]    [facility] Health Urgent Care Center [facility]) Get Driving Directions [phone] [address] [place], [place] [address]  [facility] Health Urgent Care Center [facility]) Get Driving Directions [phone] [address] [place],  [place]  [address]  [facility] Health Urgent Care at [facility] Get Driving Directions [phone] [address] [place], [place] [address]   [facility] Health Urgent Care at [facility] Get Driving Directions  [phone] [address] [place], [place] [address]   [facility] Health Urgent Care at [facility] Directions [phone] [address] [place], [place] [address]  Your MyChart E-visit questionnaire answers were reviewed by a board certified advanced clinical practitioner to complete your personal care  plan based on your specific symptoms.  Thank you for using e-Visits.

## 2021-11-04 ENCOUNTER — Encounter (HOSPITAL_COMMUNITY): Payer: Self-pay

## 2021-11-04 ENCOUNTER — Other Ambulatory Visit: Payer: Self-pay

## 2021-11-04 ENCOUNTER — Ambulatory Visit (HOSPITAL_COMMUNITY)
Admission: RE | Admit: 2021-11-04 | Discharge: 2021-11-04 | Disposition: A | Discharge status: Home / Self Care | Payer: 59 | Source: Ambulatory Visit | Attending: Family Medicine | Admitting: Family Medicine

## 2021-11-04 ENCOUNTER — Other Ambulatory Visit (HOSPITAL_COMMUNITY): Payer: Self-pay

## 2021-11-04 VITALS — BP 112/67 | HR 101 | Temp 98.8°F | Resp 20

## 2021-11-04 DIAGNOSIS — J069 Acute upper respiratory infection, unspecified: Secondary | ICD-10-CM | POA: Diagnosis not present

## 2021-11-04 HISTORY — DX: Anxiety disorder, unspecified: F41.9

## 2021-11-04 MED ORDER — BENZONATATE 100 MG PO CAPS
ORAL_CAPSULE | ORAL | 0 refills | Status: DC
  Filled 2021-11-04: qty 21, 7d supply, fill #0

## 2021-11-04 NOTE — ED Provider Notes (Signed)
  [facility] CARE CENTER   [id number] 11/04/21 Arrival Time: 1058  ASSESSMENT & PLAN:  1. Viral URI with cough    Discussed typical duration of viral illnesses. OTC symptom care as needed.  Meds ordered this encounter  Medications   benzonatate (TESSALON) 100 MG capsule    Sig: Take 1 capsule by mouth every 8 (eight) hours for cough.    Dispense:  21 capsule    Refill:  0     Follow-up Information     Nche, Bonna Gains, NP.   Specialty: Internal Medicine Why: As needed. Contact information: [address] [place] [address] [phone]                 Reviewed expectations re: course of current medical issues. Questions answered. Outlined signs and symptoms indicating need for more acute intervention. Understanding verbalized. After Visit Summary given.   SUBJECTIVE: History from: patient. Maria Wiggins is a 30 y.o. female who reports: cough, nasal congestion/pressure; x 2-3 d. Denies: fever and difficulty breathing. Normal PO intake without n/v/d.   OBJECTIVE:  Vitals:   11/04/21 1116  BP: 112/67  Pulse: (!) 101  Resp: 20  Temp: 98.8 F (37.1 C)  TempSrc: Oral  SpO2: 100%    General appearance: alert; no distress Eyes: PERRLA; EOMI; conjunctiva normal HENT: [place]; AT; with significant nasal congestion Neck: supple  Lungs: speaks full sentences without difficulty; unlabored Extremities: no edema Skin: warm and dry Neurologic: normal gait Psychological: alert and cooperative; normal mood and affect   No Known Allergies  Past Medical History:  Diagnosis Date   Anxiety    Social History   Socioeconomic History   Marital status: Single    Spouse name: Not on file   Number of children: 1   Years of education: Not on file   Highest education level: Not on file  Occupational History   Not on file  Tobacco Use   Smoking status: Never   Smokeless tobacco: Never  Vaping Use   Vaping Use: Never used  Substance and Sexual  Activity   Alcohol use: Not Currently   Drug use: Never   Sexual activity: Not Currently  Other Topics Concern   Not on file  Social History Narrative   Not on file   Social Determinants of Health   Financial Resource Strain: Not on file  Food Insecurity: Not on file  Transportation Needs: Not on file  Physical Activity: Not on file  Stress: Not on file  Social Connections: Not on file  Intimate Partner Violence: Not on file   Family History  Problem Relation Age of Onset   Diabetes Father    History reviewed. No pertinent surgical history.   Mardella Layman, MD 11/04/21 1241

## 2021-11-04 NOTE — ED Triage Notes (Signed)
Symptoms started 10/31/2021.  Complains of cough, pain around eyes, stuffy nose, low grade fever last night.

## 2021-11-11 ENCOUNTER — Other Ambulatory Visit (HOSPITAL_COMMUNITY): Payer: Self-pay

## 2021-11-13 ENCOUNTER — Other Ambulatory Visit (HOSPITAL_COMMUNITY): Payer: Self-pay

## 2021-12-18 ENCOUNTER — Other Ambulatory Visit: Payer: Self-pay

## 2021-12-28 ENCOUNTER — Other Ambulatory Visit (HOSPITAL_COMMUNITY): Payer: Self-pay

## 2022-01-18 ENCOUNTER — Other Ambulatory Visit (HOSPITAL_COMMUNITY): Payer: Self-pay

## 2022-01-20 ENCOUNTER — Other Ambulatory Visit (HOSPITAL_COMMUNITY): Payer: Self-pay

## 2022-01-21 ENCOUNTER — Other Ambulatory Visit (HOSPITAL_COMMUNITY): Payer: Self-pay

## 2022-01-22 ENCOUNTER — Ambulatory Visit (INDEPENDENT_AMBULATORY_CARE_PROVIDER_SITE_OTHER): Payer: No Typology Code available for payment source | Admitting: Nurse Practitioner

## 2022-01-22 ENCOUNTER — Other Ambulatory Visit (HOSPITAL_COMMUNITY)
Admission: RE | Admit: 2022-01-22 | Discharge: 2022-01-22 | Disposition: A | Discharge status: Home / Self Care | Payer: No Typology Code available for payment source | Source: Ambulatory Visit | Attending: Nurse Practitioner | Admitting: Nurse Practitioner

## 2022-01-22 ENCOUNTER — Other Ambulatory Visit: Payer: Self-pay

## 2022-01-22 ENCOUNTER — Encounter: Payer: Self-pay | Admitting: Nurse Practitioner

## 2022-01-22 ENCOUNTER — Other Ambulatory Visit (HOSPITAL_COMMUNITY): Payer: Self-pay

## 2022-01-22 VITALS — BP 124/80 | HR 90 | Temp 97.4°F | Ht 68.0 in | Wt 222.2 lb

## 2022-01-22 DIAGNOSIS — F3342 Major depressive disorder, recurrent, in full remission: Secondary | ICD-10-CM

## 2022-01-22 DIAGNOSIS — Z124 Encounter for screening for malignant neoplasm of cervix: Secondary | ICD-10-CM | POA: Diagnosis present

## 2022-01-22 DIAGNOSIS — Z0001 Encounter for general adult medical examination with abnormal findings: Secondary | ICD-10-CM | POA: Diagnosis not present

## 2022-01-22 DIAGNOSIS — Z3041 Encounter for surveillance of contraceptive pills: Secondary | ICD-10-CM

## 2022-01-22 DIAGNOSIS — Z3042 Encounter for surveillance of injectable contraceptive: Secondary | ICD-10-CM | POA: Insufficient documentation

## 2022-01-22 DIAGNOSIS — Z6833 Body mass index (BMI) 33.0-33.9, adult: Secondary | ICD-10-CM

## 2022-01-22 DIAGNOSIS — Z113 Encounter for screening for infections with a predominantly sexual mode of transmission: Secondary | ICD-10-CM

## 2022-01-22 DIAGNOSIS — E6609 Other obesity due to excess calories: Secondary | ICD-10-CM | POA: Diagnosis not present

## 2022-01-22 MED ORDER — BUPROPION HCL 75 MG PO TABS
75.0000 mg | ORAL_TABLET | Freq: Two times a day (BID) | ORAL | 3 refills | Status: DC
  Filled 2022-01-22: qty 180, 90d supply, fill #0

## 2022-01-22 MED ORDER — SAXENDA 18 MG/3ML ~~LOC~~ SOPN
3.0000 mg | PEN_INJECTOR | Freq: Every day | SUBCUTANEOUS | 2 refills | Status: DC
  Filled 2022-01-25: qty 15, 30d supply, fill #0
  Filled 2022-03-01: qty 15, 30d supply, fill #1
  Filled 2022-04-12: qty 15, 30d supply, fill #2

## 2022-01-22 MED ORDER — VIENVA 0.1-20 MG-MCG PO TABS
1.0000 | ORAL_TABLET | Freq: Every day | ORAL | 3 refills | Status: DC
  Filled 2022-01-22: qty 84, 84d supply, fill #0
  Filled 2022-04-12: qty 84, 84d supply, fill #1
  Filled 2022-06-18: qty 84, 84d supply, fill #2
  Filled 2022-09-27: qty 84, 84d supply, fill #3
  Filled 2022-09-29: qty 28, 28d supply, fill #3
  Filled 2022-09-30: qty 56, 56d supply, fill #3

## 2022-01-22 NOTE — Patient Instructions (Signed)
Maintain saxenda dose. Go to lab for blood draw. Complete self breast exam monthly Incorporate daily weight training and/or resistance training exercise.  Exercising to Lose Weight Getting regular exercise is important for everyone. It is especially important if you are overweight. Being overweight increases your risk of heart disease, stroke, diabetes, high blood pressure, and several types of cancer. Exercising, and reducing the calories you consume, can help you lose weight and improve fitness and health. Exercise can be moderate or vigorous intensity. To lose weight, most people need to do a certain amount of moderate or vigorous-intensity exercise each week. How can exercise affect me? You lose weight when you exercise enough to burn more calories than you eat. Exercise also reduces body fat and builds muscle. The more muscle you have, the more calories you burn. Exercise also: Improves mood. Reduces stress and tension. Improves your overall fitness, flexibility, and endurance. Increases bone strength. Moderate-intensity exercise Moderate-intensity exercise is any activity that gets you moving enough to burn at least three times more energy (calories) than if you were sitting. Examples of moderate exercise include: Walking a mile in 15 minutes. Doing light yard work. Biking at an easy pace. Most people should get at least 150 minutes of moderate-intensity exercise a week to maintain their body weight. Vigorous-intensity exercise Vigorous-intensity exercise is any activity that gets you moving enough to burn at least six times more calories than if you were sitting. When you exercise at this intensity, you should be working hard enough that you are not able to carry on a conversation. Examples of vigorous exercise include: Running. Playing a team sport, such as football, basketball, and soccer. Jumping rope. Most people should get at least 75 minutes a week of vigorous exercise to  maintain their body weight. What actions can I take to lose weight? The amount of exercise you need to lose weight depends on: Your age. The type of exercise. Any health conditions you have. Your overall physical ability. Talk to your health care provider about how much exercise you need and what types of activities are safe for you. Nutrition  Make changes to your diet as told by your health care provider or diet and nutrition specialist (dietitian). This may include: Eating fewer calories. Eating more protein. Eating less unhealthy fats. Eating a diet that includes fresh fruits and vegetables, whole grains, low-fat dairy products, and lean protein. Avoiding foods with added fat, salt, and sugar. Drink plenty of water while you exercise to prevent dehydration or heat stroke. Activity Choose an activity that you enjoy and set realistic goals. Your health care provider can help you make an exercise plan that works for you. Exercise at a moderate or vigorous intensity most days of the week. The intensity of exercise may vary from person to person. You can tell how intense a workout is for you by paying attention to your breathing and heartbeat. Most people will notice their breathing and heartbeat get faster with more intense exercise. Do resistance training twice each week, such as: Push-ups. Sit-ups. Lifting weights. Using resistance bands. Getting short amounts of exercise can be just as helpful as long, structured periods of exercise. If you have trouble finding time to exercise, try doing these things as part of your daily routine: Get up, stretch, and walk around every 30 minutes throughout the day. Go for a walk during your lunch break. Park your car farther away from your destination. If you take public transportation, get off one stop early and walk  the rest of the way. Make phone calls while standing up and walking around. Take the stairs instead of elevators or  escalators. Wear comfortable clothes and shoes with good support. Do not exercise so much that you hurt yourself, feel dizzy, or get very short of breath. Where to find more information [place].S. Department of Health and Human Services: ThisPath.fi Centers for Disease Control and Prevention: FootballExhibition.com.br Contact a health care provider: Before starting a new exercise program. If you have questions or concerns about your weight. If you have a medical problem that keeps you from exercising. Get help right away if: You have any of the following while exercising: Injury. Dizziness. Difficulty breathing or shortness of breath that does not go away when you stop exercising. Chest pain. Rapid heartbeat. These symptoms may represent a serious problem that is an emergency. Do not wait to see if the symptoms will go away. Get medical help right away. Call your local emergency services (911 in the [place] Do not drive yourself to the hospital. Summary Getting regular exercise is especially important if you are overweight. Being overweight increases your risk of heart disease, stroke, diabetes, high blood pressure, and several types of cancer. Losing weight happens when you burn more calories than you eat. Reducing the amount of calories you eat, and getting regular moderate or vigorous exercise each week, helps you lose weight. This information is not intended to replace advice given to you by your health care provider. Make sure you discuss any questions you have with your health care provider. Document Revised: 01/11/2021 Document Reviewed: 01/11/2021 Elsevier Patient Education  2022 [employer].

## 2022-01-22 NOTE — Assessment & Plan Note (Addendum)
Lost 8lbs in last 47months Lost total of 16lbs in last 25months Current use of saxenda 3mg  daily denies nay adverse side effects Diet: low fat/low carb and small meal portions Exercise: cardio-walking daily Wt Readings from Last 3 Encounters:  01/22/22 222 lb 3.2 oz (100.8 kg)  10/20/21 230 lb 9.6 oz (104.6 kg)  09/08/21 238 lb 9.6 oz (108.2 kg)   Maintain saxenda dose Advise to incorporate daily weight training and/or resistant training. F/up in 3months

## 2022-01-22 NOTE — Progress Notes (Signed)
Subjective:    Patient ID: Maria Wiggins, female    DOB: [birth year], 31 y.o.   MRN: [id number]  Patient presents today for CPE and eval of chronic conditions  HPI Obesity (BMI 30-39.9) Lost 8lbs in last 533month Lost total of 16lbs in last 450monthCurrent use of saxenda 33m54maily denies nay adverse side effects Diet: low fat/low carb and small meal portions Exercise: cardio-walking 55m38mdaily Wt Readings from Last 3 Encounters:  01/22/22 222 lb 3.2 oz (100.8 kg)  10/20/21 230 lb 9.6 oz (104.6 kg)  09/08/21 238 lb 9.6 oz (108.2 kg)   Maintain saxenda dose Advise to incorporate daily weight training and/or resistant training. F/up in 33mon433monthpression, recurrent (HCC) [place] mood with wellbutrin Maintain med dose  Vision:up to date Dental:up to date Sexual History (orientation,birth control, marital status, STD): sexually active, use of oral COC, needs breast and pelvic exam today, agreed to STD screen  Depression/Suicide: Depression screen PHQ [facility]2/24/2023 10/20/2021 07/23/2021  Decreased Interest 0 1 2  Down, Depressed, Hopeless 0 0 1  PHQ - 2 Score 0 1 3  Altered sleeping 0 0 2  Tired, decreased energy 0 1 2  Change in appetite 0 0 3  Feeling bad or failure about yourself  0 0 2  Trouble concentrating 0 0 1  Moving slowly or fidgety/restless 0 0 2  Suicidal thoughts 0 0 0  PHQ-9 Score 0 2 15  Difficult doing work/chores - Not difficult at all Somewhat difficult   GAD 7 : Generalized Anxiety Score 01/22/2022 10/20/2021 07/23/2021  Nervous, Anxious, on Edge 0 1 2  Control/stop worrying 0 1 3  Worry too much - different things 0 1 3  Trouble relaxing 0 0 2  Restless 0 0 2  Easily annoyed or irritable 0 1 2  Afraid - awful might happen 0 1 2  Total GAD 7 Score 0 5 16  Anxiety Difficulty - Not difficult at all Somewhat difficult    Immunizations: (TDAP, Hep C screen, Pneumovax, Influenza, zoster)  Health Maintenance  Topic Date Due   HIV Screening  Never  done   Hepatitis C Screening: USPSTF Recommendation to screen - Ages 18-7959-79 Never done   Pap Smear  Never done   COVID-19 Vaccine (3 - Booster for Moderna series) 05/27/2020   Tetanus Vaccine  10/20/2022*   Flu Shot  Completed   HPV Vaccine  Aged Out  *Topic was postponed. The date shown is not the original due date.   Fall Risk: Fall Risk  07/23/2021  Falls in the past year? 0  Number falls in past yr: 0  Injury with Fall? 0  Risk for fall due to : No Fall Risks  Follow up Falls evaluation completed   Medications and allergies reviewed with patient and updated if appropriate.  Patient Active Problem List   Diagnosis Date Noted   Depression, recurrent (HCC) [place]25/2022   Obesity (BMI 30-39.9) 07/23/2021    Current Outpatient Medications on File Prior to Visit  Medication Sig Dispense Refill   COVID-19 At Home Antigen Test (CARESTART COVID-19 HOME TEST) KIT use as directed 4 each 0   Insulin Pen Needle 31G X 6 MM MISC Use as directed with Saxenda 100 each 0   Omega-3 Fatty Acids (FISH OIL PO) Take by mouth.     Propylene Glycol (SYSTANE BALANCE) 0.6 % SOLN Apply 1 drop to eye as needed.     tretinoin (RETIN-A) 0.025 % cream Apply a  pea-size amount onto the face 3 nights per week for 2 weeks, then increase to 5 nights per week for 2 weeks, then use nightly. 240 g 1   No current facility-administered medications on file prior to visit.    Past Medical History:  Diagnosis Date   Anxiety     History reviewed. No pertinent surgical history.  Social History   Socioeconomic History   Marital status: Single    Spouse name: Not on file   Number of children: 1   Years of education: Not on file   Highest education level: Not on file  Occupational History   Not on file  Tobacco Use   Smoking status: Never   Smokeless tobacco: Never  Vaping Use   Vaping Use: Never used  Substance and Sexual Activity   Alcohol use: Not Currently   Drug use: Never   Sexual activity: Not  Currently  Other Topics Concern   Not on file  Social History Narrative   Not on file   Social Determinants of Health   Financial Resource Strain: Not on file  Food Insecurity: Not on file  Transportation Needs: Not on file  Physical Activity: Not on file  Stress: Not on file  Social Connections: Not on file   Family History  Problem Relation Age of Onset   Diabetes Father        Review of Systems  Constitutional:  Negative for fever, malaise/fatigue and weight loss.  HENT:  Negative for congestion and sore throat.   Eyes:        Negative for visual changes  Respiratory:  Negative for cough and shortness of breath.   Cardiovascular:  Negative for chest pain, palpitations and leg swelling.  Gastrointestinal:  Negative for blood in stool, constipation, diarrhea and heartburn.  Genitourinary:  Negative for dysuria, frequency and urgency.  Musculoskeletal:  Negative for falls, joint pain and myalgias.  Skin:  Negative for rash.  Neurological:  Negative for dizziness, sensory change and headaches.  Endo/Heme/Allergies:  Does not bruise/bleed easily.  Psychiatric/Behavioral:  Negative for depression, substance abuse and suicidal ideas. The patient is not nervous/anxious.    Objective:   Vitals:   01/22/22 0822  BP: 124/80  Pulse: 90  Temp: (!) 97.4 F (36.3 C)  SpO2: 99%    Body mass index is 33.79 kg/m.   Physical Examination:  Physical Exam Vitals and nursing note reviewed. Exam conducted with a chaperone present.  Constitutional:      General: She is not in acute distress.    Appearance: She is obese.  HENT:     Right Ear: Tympanic membrane, ear canal and external ear normal.     Left Ear: Tympanic membrane, ear canal and external ear normal.  Eyes:     General: No scleral icterus.    Extraocular Movements: Extraocular movements intact.     Conjunctiva/sclera: Conjunctivae normal.     Pupils: Pupils are equal, round, and reactive to light.   Cardiovascular:     Rate and Rhythm: Normal rate and regular rhythm.     Pulses: Normal pulses.     Heart sounds: Normal heart sounds.  Pulmonary:     Effort: Pulmonary effort is normal. No respiratory distress.     Breath sounds: Normal breath sounds.  Chest:  Breasts:    Breasts are symmetrical.  Abdominal:     General: Bowel sounds are normal. There is no distension.     Palpations: Abdomen is soft.  Genitourinary:  General: Normal vulva.     Labia:        Right: No rash or tenderness.        Left: No rash or tenderness.      Urethra: No prolapse.     Vagina: Normal.     Cervix: Normal.     Uterus: Normal.   Musculoskeletal:        General: Normal range of motion.     Cervical back: Normal range of motion and neck supple.     Right lower leg: No edema.     Left lower leg: No edema.  Lymphadenopathy:     Cervical: No cervical adenopathy.     Upper Body:     Right upper body: No supraclavicular, axillary or pectoral adenopathy.     Left upper body: No supraclavicular, axillary or pectoral adenopathy.  Skin:    General: Skin is warm and dry.  Neurological:     Mental Status: She is alert and oriented to person, place, and time.  Psychiatric:        Mood and Affect: Mood normal.        Behavior: Behavior normal.        Thought Content: Thought content normal.   ASSESSMENT and PLAN: This visit occurred during the SARS-CoV-2 public health emergency.  Safety protocols were in place, including screening questions prior to the visit, additional usage of staff PPE, and extensive cleaning of exam room while observing appropriate contact time as indicated for disinfecting solutions.   Orilla was seen today for annual exam.  Diagnoses and all orders for this visit:  Encounter for preventative adult health care exam with abnormal findings  Class 1 obesity due to excess calories with serious comorbidity and body mass index (BMI) of 33.0 to 33.9 in adult -     Liraglutide  -Weight Management (SAXENDA) 18 MG/3ML SOPN; Inject 3 mg into the skin daily.  Encounter for Papanicolaou smear for cervical cancer screening -     Cytology - PAP( [address])  Screen for STD (sexually transmitted disease) -     Cervicovaginal ancillary only( [place]) -     HIV antibody (with reflex) -     RPR -     Hepatitis C Antibody  Encounter for surveillance of contraceptive pills -     VIENVA 0.1-20 MG-MCG tablet; Take 1 tablet by mouth daily.  Recurrent major depressive disorder, in full remission ([place] [facility]) -     buPROPion (WELLBUTRIN) 75 MG tablet; Take 1 tablet by mouth 2 times daily.      Problem List Items Addressed This Visit       Other   Depression, recurrent ([place])    Stable mood with wellbutrin Maintain med dose      Relevant Medications   buPROPion (WELLBUTRIN) 75 MG tablet   Obesity (BMI 30-39.9)    Lost 8lbs in last 62month Lost total of 16lbs in last 451monthCurrent use of saxenda 8m32maily denies nay adverse side effects Diet: low fat/low carb and small meal portions Exercise: cardio-walking 70m21mdaily Wt Readings from Last 3 Encounters:  01/22/22 222 lb 3.2 oz (100.8 kg)  10/20/21 230 lb 9.6 oz (104.6 kg)  09/08/21 238 lb 9.6 oz (108.2 kg)   Maintain saxenda dose Advise to incorporate daily weight training and/or resistant training. F/up in 8mon31month  Relevant Medications   Liraglutide -Weight Management (SAXENDA) 18 MG/3ML SOPN   Other Visit Diagnoses     Encounter  for preventative adult health care exam with abnormal findings    -  Primary   Encounter for Papanicolaou smear for cervical cancer screening       Relevant Orders   Cytology - PAP( [place])   Screen for STD (sexually transmitted disease)       Relevant Orders   Cervicovaginal ancillary only( [place])   HIV antibody (with reflex)   RPR   Hepatitis C Antibody   Encounter for surveillance of contraceptive pills       Relevant Medications   VIENVA 0.1-20  MG-MCG tablet       Follow up: Return in about 3 months (around 04/21/2022) for weight management.  Wilfred Lacy, NP

## 2022-01-22 NOTE — Assessment & Plan Note (Signed)
Stable mood with wellbutrin Maintain med dose

## 2022-01-25 ENCOUNTER — Other Ambulatory Visit (HOSPITAL_COMMUNITY): Payer: Self-pay

## 2022-01-25 LAB — CYTOLOGY - PAP
Comment: NEGATIVE
Diagnosis: NEGATIVE
High risk HPV: NEGATIVE

## 2022-01-25 LAB — CERVICOVAGINAL ANCILLARY ONLY
Chlamydia: NEGATIVE
Comment: NEGATIVE
Comment: NEGATIVE
Comment: NORMAL
Neisseria Gonorrhea: NEGATIVE
Trichomonas: NEGATIVE

## 2022-01-25 LAB — HEPATITIS C ANTIBODY
Hepatitis C Ab: NONREACTIVE
SIGNAL TO CUT-OFF: 0.05 (ref <1.00)

## 2022-01-25 LAB — RPR: RPR Ser Ql: NONREACTIVE

## 2022-01-25 LAB — HIV ANTIBODY (ROUTINE TESTING W REFLEX): HIV 1&2 Ab, 4th Generation: NONREACTIVE

## 2022-01-26 NOTE — Progress Notes (Signed)
Normal, repeat in 3-30yrs. Negative STD screen

## 2022-02-15 ENCOUNTER — Other Ambulatory Visit (HOSPITAL_COMMUNITY): Payer: Self-pay

## 2022-03-01 ENCOUNTER — Other Ambulatory Visit (HOSPITAL_COMMUNITY): Payer: Self-pay

## 2022-04-07 ENCOUNTER — Other Ambulatory Visit (HOSPITAL_COMMUNITY): Payer: Self-pay

## 2022-04-07 MED ORDER — COVID-19 AT HOME ANTIGEN TEST VI KIT
PACK | 0 refills | Status: DC
  Filled 2022-04-07: qty 4, 30d supply, fill #0

## 2022-04-12 ENCOUNTER — Other Ambulatory Visit (HOSPITAL_COMMUNITY): Payer: Self-pay

## 2022-04-13 ENCOUNTER — Other Ambulatory Visit (HOSPITAL_COMMUNITY): Payer: Self-pay

## 2022-04-14 ENCOUNTER — Other Ambulatory Visit (HOSPITAL_COMMUNITY): Payer: Self-pay

## 2022-04-23 ENCOUNTER — Other Ambulatory Visit (HOSPITAL_COMMUNITY): Payer: Self-pay

## 2022-04-23 ENCOUNTER — Ambulatory Visit (INDEPENDENT_AMBULATORY_CARE_PROVIDER_SITE_OTHER): Payer: No Typology Code available for payment source | Admitting: Nurse Practitioner

## 2022-04-23 ENCOUNTER — Encounter: Payer: Self-pay | Admitting: Nurse Practitioner

## 2022-04-23 VITALS — BP 128/78 | HR 70 | Temp 97.4°F | Ht 68.0 in | Wt 225.2 lb

## 2022-04-23 DIAGNOSIS — E6609 Other obesity due to excess calories: Secondary | ICD-10-CM

## 2022-04-23 DIAGNOSIS — F3342 Major depressive disorder, recurrent, in full remission: Secondary | ICD-10-CM | POA: Diagnosis not present

## 2022-04-23 DIAGNOSIS — Z6834 Body mass index (BMI) 34.0-34.9, adult: Secondary | ICD-10-CM

## 2022-04-23 MED ORDER — WEGOVY 1 MG/0.5ML ~~LOC~~ SOAJ
1.0000 mg | SUBCUTANEOUS | 0 refills | Status: DC
  Filled 2022-04-23: qty 2, 28d supply, fill #0

## 2022-04-23 MED ORDER — BUPROPION HCL ER (XL) 150 MG PO TB24
150.0000 mg | ORAL_TABLET | Freq: Every day | ORAL | 3 refills | Status: DC
Start: 2022-04-23 — End: 2023-02-28
  Filled 2022-04-23: qty 90, 90d supply, fill #0
  Filled 2022-07-14: qty 30, 30d supply, fill #1
  Filled 2022-08-20: qty 30, 30d supply, fill #2
  Filled 2022-09-27: qty 30, 30d supply, fill #3
  Filled 2022-10-22: qty 30, 30d supply, fill #4
  Filled 2022-12-07 – 2023-01-14 (×2): qty 30, 30d supply, fill #5

## 2022-04-23 NOTE — Patient Instructions (Addendum)
Keep up the good work Switch saxenda to [employer] 1mg  weekly. Maintain wellbutrin dose

## 2022-04-23 NOTE — Progress Notes (Signed)
              Established Patient Visit  Patient: Maria Wiggins   DOB: 07/19/1991   31 y.o. Female  MRN: [id number] Visit Date: 04/23/2022  Subjective:    Chief Complaint  Patient presents with   Office Visit    F/u on weight management Says she feels like saxenda is wearing off, weight has stalled, possible medication change. Walks 3 times a week, doesn't drink enough water. No other concerns.   HPI Obesity (BMI 30-39.9) Total weight loss with saxenda: 16lbs in last 6months Current use of saxenda 3mg daily.  Report minimal appetite suppression. No wieight loss in last 2months Weight gain 3lbs in last 3months Has maintain heart healthy diet and daily exercise (cardio 3s/week 2miles each and weight training-2x/week)) Wt Readings from Last 3 Encounters:  04/23/22 225 lb 3.2 oz (102.2 kg)  01/22/22 222 lb 3.2 oz (100.8 kg)  10/20/21 230 lb 9.6 oz (104.6 kg)   She agreed to switch saxenda to wegovy. New rx sent F/up in 1month   Depression, recurrent (HCC) Stable mood with wellbutrin 75mg BID. No adverse side effects.  Agreed to switch dose to 150mg XR daily New rx sent F/up in 3months Reviewed medical, surgical, and social history today  Medications: Outpatient Medications Prior to Visit  Medication Sig   Insulin Pen Needle 31G X 6 MM MISC Use as directed with Saxenda   Omega-3 Fatty Acids (FISH OIL PO) Take by mouth.   Propylene Glycol (SYSTANE BALANCE) 0.6 % SOLN Apply 1 drop to eye as needed.   tretinoin (RETIN-A) 0.025 % cream Apply a pea-size amount onto the face 3 nights per week for 2 weeks, then increase to 5 nights per week for 2 weeks, then use nightly.   VIENVA 0.1-20 MG-MCG tablet Take 1 tablet by mouth daily.   [DISCONTINUED] buPROPion (WELLBUTRIN) 75 MG tablet Take 1 tablet by mouth 2 times daily.   [DISCONTINUED] Liraglutide -Weight Management (SAXENDA) 18 MG/3ML SOPN Inject 3 mg into the skin daily.   [DISCONTINUED] COVID-19 At Home Antigen Test  (CARESTART COVID-19 HOME TEST) KIT use as directed (Patient not taking: Reported on 04/23/2022)   [DISCONTINUED] COVID-19 At Home Antigen Test (CARESTART COVID-19 HOME TEST) KIT Use as directed. (Patient not taking: Reported on 04/23/2022)   No facility-administered medications prior to visit.   Reviewed past medical and social history.   ROS per HPI above      Objective:  BP 128/78 (BP Location: Right Arm, Patient Position: Sitting, Cuff Size: Normal)   Pulse 70   Temp (!) 97.4 F (36.3 C) (Temporal)   Ht 5' 8" (1.727 m)   Wt 225 lb 3.2 oz (102.2 kg)   SpO2 99%   BMI 34.24 kg/m      Physical Exam Constitutional:      Appearance: She is obese.  Cardiovascular:     Rate and Rhythm: Normal rate.     Pulses: Normal pulses.  Pulmonary:     Effort: Pulmonary effort is normal.  Neurological:     Mental Status: She is alert and oriented to person, place, and time.  Psychiatric:        Mood and Affect: Mood normal.        Behavior: Behavior normal.        Thought Content: Thought content normal.    No results found for any visits on 04/23/22.    Assessment & Plan:    Problem List Items Addressed This Visit         Other   Depression, recurrent (HCC)    Stable mood with wellbutrin 75mg BID. No adverse side effects.  Agreed to switch dose to 150mg XR daily New rx sent F/up in 3months       Relevant Medications   buPROPion (WELLBUTRIN XL) 150 MG 24 hr tablet   Obesity (BMI 30-39.9) - Primary    Total weight loss with saxenda: 16lbs in last 6months Current use of saxenda 3mg daily.  Report minimal appetite suppression. No wieight loss in last 2months Weight gain 3lbs in last 3months Has maintain heart healthy diet and daily exercise (cardio 3s/week 2miles each and weight training-2x/week)) Wt Readings from Last 3 Encounters:  04/23/22 225 lb 3.2 oz (102.2 kg)  01/22/22 222 lb 3.2 oz (100.8 kg)  10/20/21 230 lb 9.6 oz (104.6 kg)   She agreed to switch saxenda to  wegovy. New rx sent F/up in 1month       Relevant Medications   Semaglutide-Weight Management (WEGOVY) 1 MG/0.5ML SOAJ   Return in about 4 weeks (around 05/21/2022) for weight management (fasting).     Charlotte Nche, NP   

## 2022-04-23 NOTE — Assessment & Plan Note (Addendum)
Total weight loss with saxenda: 16lbs in last 32months Current use of saxenda 3mg  daily.  Report minimal appetite suppression. No wieight loss in last 12months Weight gain 3lbs in last 9months Has maintain heart healthy diet and daily exercise (cardio 3s/week 63miles each and weight training-2x/week)) Wt Readings from Last 3 Encounters:  04/23/22 225 lb 3.2 oz (102.2 kg)  01/22/22 222 lb 3.2 oz (100.8 kg)  10/20/21 230 lb 9.6 oz (104.6 kg)   She agreed to switch saxenda to [employer]. New rx sent F/up in 41month

## 2022-04-23 NOTE — Assessment & Plan Note (Signed)
Stable mood with wellbutrin 75mg  BID. No adverse side effects.  Agreed to switch dose to 150mg  XR daily New rx sent F/up in 9months

## 2022-04-27 ENCOUNTER — Telehealth: Payer: Self-pay | Admitting: Nurse Practitioner

## 2022-04-27 NOTE — Telephone Encounter (Signed)
Pt called to f/u stating PA form for [facility] was sent from the Pharmacy Friday 5/26  Call back [phone]

## 2022-04-28 ENCOUNTER — Telehealth: Payer: Self-pay

## 2022-04-28 NOTE — Telephone Encounter (Signed)
PA for [facility] submitted to [employer] through cover my meds. Awaiting response. Dm/cma   Key: Tawni Levy

## 2022-04-29 ENCOUNTER — Other Ambulatory Visit (HOSPITAL_COMMUNITY): Payer: Self-pay

## 2022-04-29 NOTE — Telephone Encounter (Signed)
PA approved form 04/29/22 - 11/18/22. Pharmacy notified VIA phone. Dm/cma

## 2022-05-10 ENCOUNTER — Other Ambulatory Visit (HOSPITAL_COMMUNITY): Payer: Self-pay

## 2022-05-10 ENCOUNTER — Telehealth: Payer: No Typology Code available for payment source | Admitting: Physician Assistant

## 2022-05-10 ENCOUNTER — Other Ambulatory Visit: Payer: Self-pay | Admitting: Nurse Practitioner

## 2022-05-10 DIAGNOSIS — M545 Low back pain, unspecified: Secondary | ICD-10-CM | POA: Diagnosis not present

## 2022-05-10 DIAGNOSIS — E6609 Other obesity due to excess calories: Secondary | ICD-10-CM

## 2022-05-10 MED ORDER — NAPROXEN 500 MG PO TABS
500.0000 mg | ORAL_TABLET | Freq: Two times a day (BID) | ORAL | 0 refills | Status: DC
  Filled 2022-05-10 (×2): qty 30, 15d supply, fill #0

## 2022-05-10 MED ORDER — WEGOVY 1.7 MG/0.75ML ~~LOC~~ SOAJ
1.7000 mg | SUBCUTANEOUS | 0 refills | Status: DC
  Filled 2022-05-10: qty 3, 28d supply, fill #0

## 2022-05-10 MED ORDER — CYCLOBENZAPRINE HCL 10 MG PO TABS
5.0000 mg | ORAL_TABLET | Freq: Three times a day (TID) | ORAL | 0 refills | Status: DC | PRN
Start: 2022-05-10 — End: 2022-05-28
  Filled 2022-05-10: qty 30, 10d supply, fill #0

## 2022-05-10 NOTE — Progress Notes (Signed)

## 2022-05-11 ENCOUNTER — Other Ambulatory Visit (HOSPITAL_COMMUNITY): Payer: Self-pay

## 2022-05-12 ENCOUNTER — Other Ambulatory Visit (HOSPITAL_COMMUNITY): Payer: Self-pay

## 2022-05-21 ENCOUNTER — Ambulatory Visit: Payer: No Typology Code available for payment source | Admitting: Nurse Practitioner

## 2022-05-24 ENCOUNTER — Other Ambulatory Visit: Payer: Self-pay | Admitting: Nurse Practitioner

## 2022-05-24 ENCOUNTER — Other Ambulatory Visit (HOSPITAL_COMMUNITY): Payer: Self-pay

## 2022-05-24 ENCOUNTER — Telehealth: Payer: Self-pay | Admitting: Nurse Practitioner

## 2022-05-24 DIAGNOSIS — E6609 Other obesity due to excess calories: Secondary | ICD-10-CM

## 2022-05-25 ENCOUNTER — Other Ambulatory Visit (HOSPITAL_COMMUNITY): Payer: Self-pay

## 2022-05-27 ENCOUNTER — Other Ambulatory Visit (HOSPITAL_COMMUNITY): Payer: Self-pay

## 2022-05-27 MED ORDER — WEGOVY 1.7 MG/0.75ML ~~LOC~~ SOAJ
1.7000 mg | SUBCUTANEOUS | 0 refills | Status: DC
  Filled 2022-05-27: qty 3, 28d supply, fill #0

## 2022-05-27 NOTE — Telephone Encounter (Signed)
Chart supports Rx Last OV: 03/2022 Next OV: 04/2022

## 2022-05-28 ENCOUNTER — Encounter: Payer: Self-pay | Admitting: Nurse Practitioner

## 2022-05-28 ENCOUNTER — Ambulatory Visit (INDEPENDENT_AMBULATORY_CARE_PROVIDER_SITE_OTHER): Payer: No Typology Code available for payment source | Admitting: Nurse Practitioner

## 2022-05-28 ENCOUNTER — Other Ambulatory Visit (HOSPITAL_COMMUNITY): Payer: Self-pay

## 2022-05-28 VITALS — BP 118/78 | HR 87 | Temp 96.9°F | Ht 68.0 in | Wt 218.2 lb

## 2022-05-28 DIAGNOSIS — E669 Obesity, unspecified: Secondary | ICD-10-CM | POA: Diagnosis not present

## 2022-05-28 LAB — COMPREHENSIVE METABOLIC PANEL
ALT: 18 U/L (ref 0–35)
AST: 20 U/L (ref 0–37)
Albumin: 4.3 g/dL (ref 3.5–5.2)
Alkaline Phosphatase: 52 U/L (ref 39–117)
BUN: 10 mg/dL (ref 6–23)
CO2: 24 mEq/L (ref 19–32)
Calcium: 9.2 mg/dL (ref 8.4–10.5)
Chloride: 107 mEq/L (ref 96–112)
Creatinine, Ser: 1.26 mg/dL — ABNORMAL HIGH (ref 0.40–1.20)
GFR: 56.16 mL/min — ABNORMAL LOW (ref >60.00)
Glucose, Bld: 90 mg/dL (ref 70–99)
Potassium: 5.2 mEq/L — ABNORMAL HIGH (ref 3.5–5.1)
Sodium: 138 mEq/L (ref 135–145)
Total Bilirubin: 0.6 mg/dL (ref 0.2–1.2)
Total Protein: 7.3 g/dL (ref 6.0–8.3)

## 2022-05-28 LAB — TSH: TSH: 2.45 u[IU]/mL (ref 0.35–5.50)

## 2022-05-28 LAB — LIPID PANEL
Cholesterol: 127 mg/dL (ref 0–200)
HDL: 51.6 mg/dL (ref >39.00)
LDL Cholesterol: 67 mg/dL (ref 0–99)
NonHDL: 75.74
Total CHOL/HDL Ratio: 2
Triglycerides: 43 mg/dL (ref 0.0–149.0)
VLDL: 8.6 mg/dL (ref 0.0–40.0)

## 2022-05-28 MED ORDER — WEGOVY 2.4 MG/0.75ML ~~LOC~~ SOAJ
2.4000 mg | SUBCUTANEOUS | 1 refills | Status: DC
  Filled 2022-05-28: qty 3, 28d supply, fill #0
  Filled 2022-06-21: qty 3, 28d supply, fill #1

## 2022-05-28 NOTE — Patient Instructions (Signed)
Go to lab Increase wegovy dose to 2.4mg  weekly F/up in 72months.

## 2022-05-28 NOTE — Progress Notes (Signed)
Established Patient Visit  Patient: Maria Wiggins   DOB: January 31, 1991   31 y.o. Female  MRN: [id number] Visit Date: 05/28/2022  Subjective:    Chief Complaint  Patient presents with   Office Visit    4 week f/u  Weight management No concerns   HPI Obesity (BMI 30-39.9) Lost additional 7lbs in last 91month Total weight loss in last 11months: 23lbs. Denies any adverse effects with wegovy 1.7mg  She has continued high protein/low fat/low carb diet and daily exercise Wt Readings from Last 3 Encounters:  05/28/22 218 lb 3.2 oz (99 kg)  04/23/22 225 lb 3.2 oz (102.2 kg)  01/22/22 222 lb 3.2 oz (100.8 kg)   Increased wegovy dose to 2.4mg  weekly. Check CMP, TSH and lipid panel F/up in 99months   Reviewed medical, surgical, and social history today  Medications: Outpatient Medications Prior to Visit  Medication Sig   buPROPion (WELLBUTRIN XL) 150 MG 24 hr tablet Take 1 tablet (150 mg total) by mouth daily.   Insulin Pen Needle 31G X 6 MM MISC Use as directed with Saxenda   Omega-3 Fatty Acids (FISH OIL PO) Take by mouth.   Propylene Glycol (SYSTANE BALANCE) 0.6 % SOLN Apply 1 drop to eye as needed.   VIENVA 0.1-20 MG-MCG tablet Take 1 tablet by mouth daily.   [DISCONTINUED] Semaglutide-Weight Management (WEGOVY) 1.7 MG/0.75ML SOAJ Inject 1.7 mg into the skin once a week.   [DISCONTINUED] cyclobenzaprine (FLEXERIL) 10 MG tablet Take 1/2-1 tablet (5-10 mg total) by mouth 3 (three) times daily as needed for muscle spasms. (Patient not taking: Reported on 05/28/2022)   [DISCONTINUED] naproxen (NAPROSYN) 500 MG tablet Take 1 tablet (500 mg total) by mouth 2 (two) times daily with a meal. (Patient not taking: Reported on 05/28/2022)   [DISCONTINUED] tretinoin (RETIN-A) 0.025 % cream Apply a pea-size amount onto the face 3 nights per week for 2 weeks, then increase to 5 nights per week for 2 weeks, then use nightly. (Patient not taking: Reported on 05/28/2022)   No  facility-administered medications prior to visit.   Reviewed past medical and social history.   ROS per HPI above      Objective:  BP 118/78 (BP Location: Right Arm, Patient Position: Sitting, Cuff Size: Normal)   Pulse 87   Temp (!) 96.9 F (36.1 C) (Temporal)   Ht 5\' 8"  (1.727 m)   Wt 218 lb 3.2 oz (99 kg)   SpO2 99%   BMI 33.18 kg/m      Physical Exam Cardiovascular:     Rate and Rhythm: Normal rate.     Pulses: Normal pulses.  Pulmonary:     Effort: Pulmonary effort is normal.  Neurological:     Mental Status: She is alert and oriented to person, place, and time.  Psychiatric:        Mood and Affect: Mood normal.        Behavior: Behavior normal.        Thought Content: Thought content normal.     No results found for any visits on 05/28/22.    Assessment & Plan:    Problem List Items Addressed This Visit       Other   Obesity (BMI 30-39.9) - Primary    Lost additional 7lbs in last 46month Total weight loss in last 6months: 23lbs. Denies any adverse effects with wegovy 1.7mg  She has continued high protein/low fat/low carb diet and daily  exercise Wt Readings from Last 3 Encounters:  05/28/22 218 lb 3.2 oz (99 kg)  04/23/22 225 lb 3.2 oz (102.2 kg)  01/22/22 222 lb 3.2 oz (100.8 kg)   Increased wegovy dose to 2.4mg  weekly. Check CMP, TSH and lipid panel F/up in 62months      Relevant Medications   Semaglutide-Weight Management (WEGOVY) 2.4 MG/0.75ML SOAJ   Other Relevant Orders   Comprehensive metabolic panel   TSH   Lipid panel   Return in about 2 months (around 07/28/2022) for weight management.     Alysia Penna, NP

## 2022-05-28 NOTE — Assessment & Plan Note (Addendum)
Lost additional 7lbs in last 76month Total weight loss in last 37months: 23lbs. Denies any adverse effects with wegovy 1.7mg  She has continued high protein/low fat/low carb diet and daily exercise Wt Readings from Last 3 Encounters:  05/28/22 218 lb 3.2 oz (99 kg)  04/23/22 225 lb 3.2 oz (102.2 kg)  01/22/22 222 lb 3.2 oz (100.8 kg)   Increased wegovy dose to 2.4mg  weekly. Check CMP, TSH and lipid panel F/up in 60months

## 2022-05-31 IMAGING — DX DG CHEST 2V
2 series · 2 of 2 positions shown · non-contrast
Comparison: None.

CLINICAL DATA: Chest pain

EXAM:
CHEST - 2 VIEW

[chest pa]
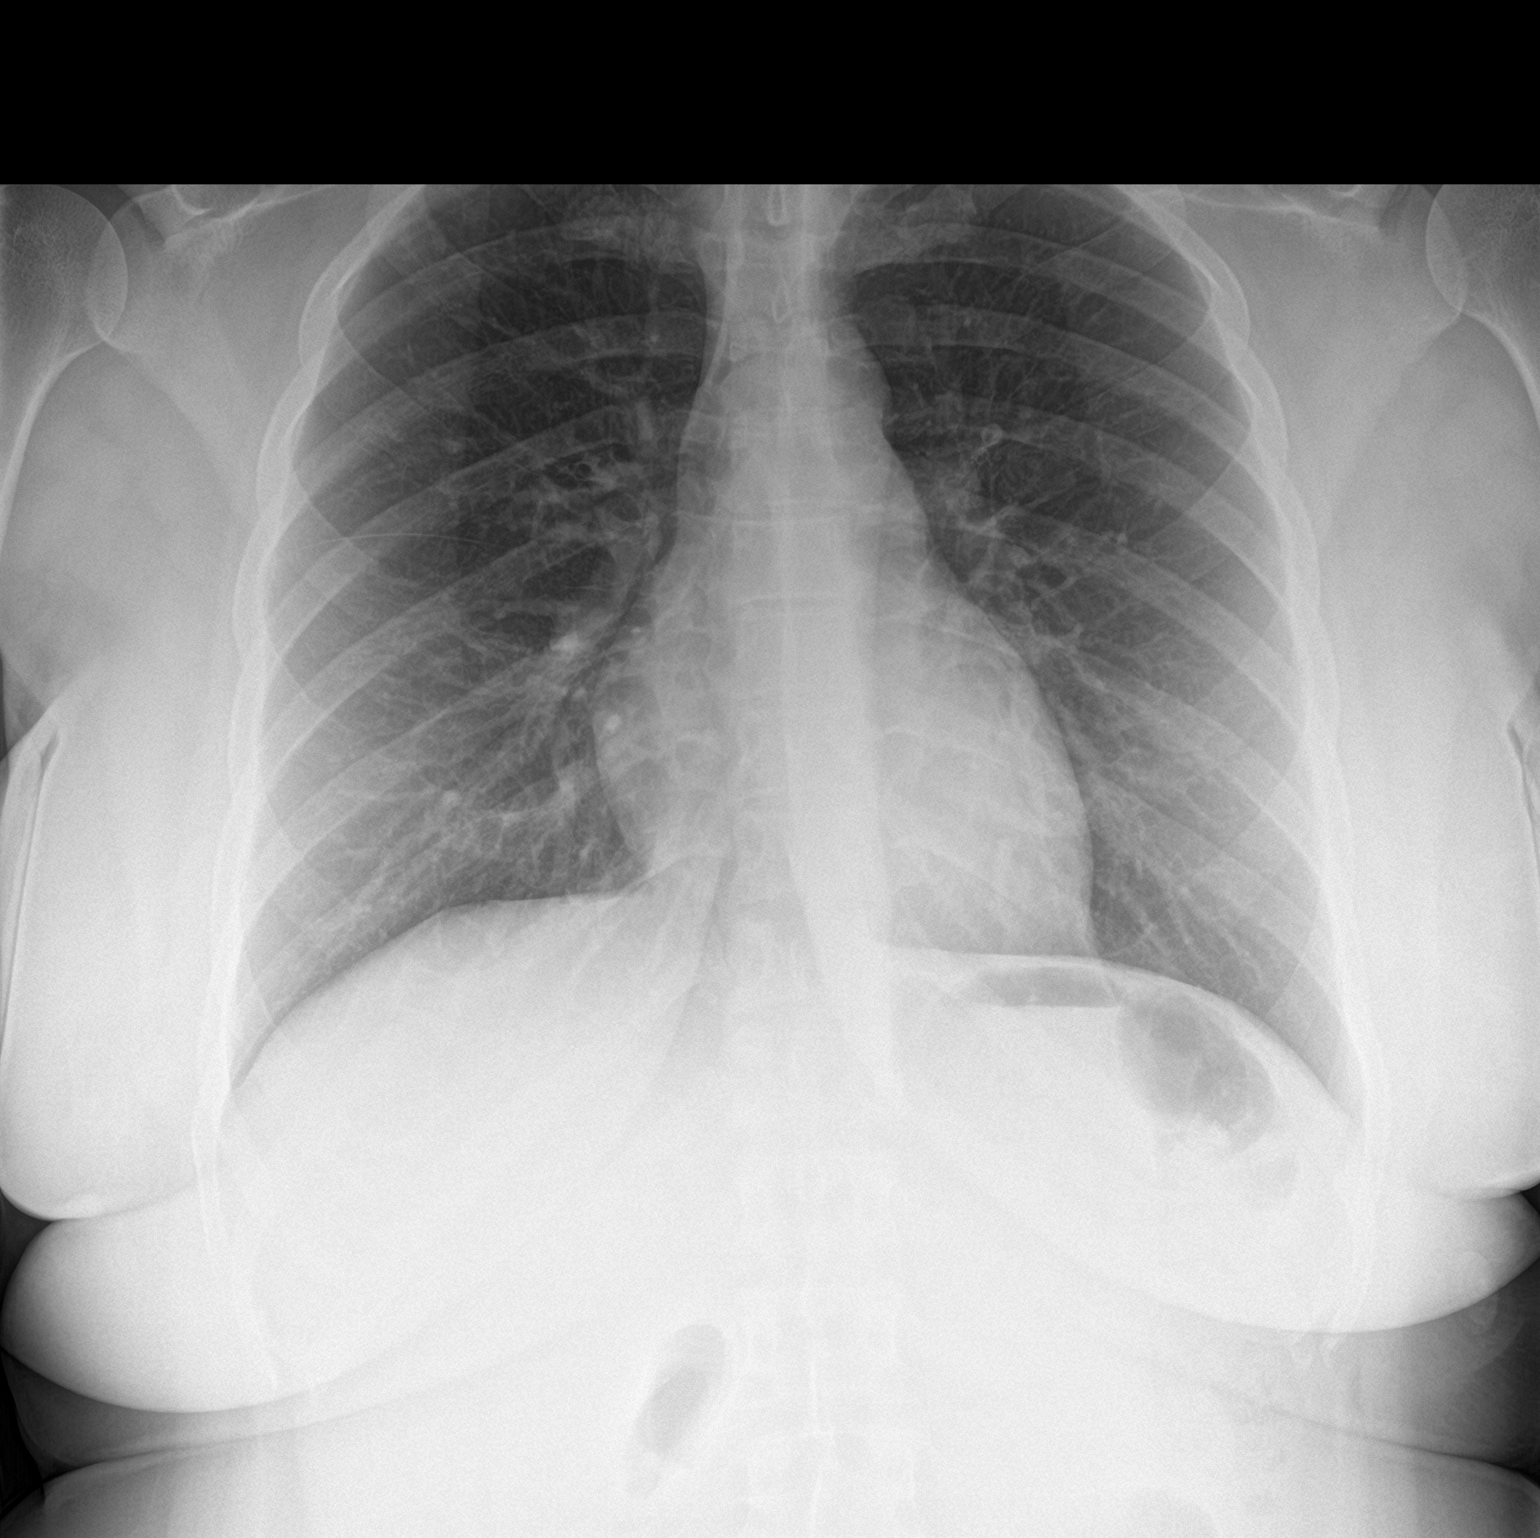

[chest lat]
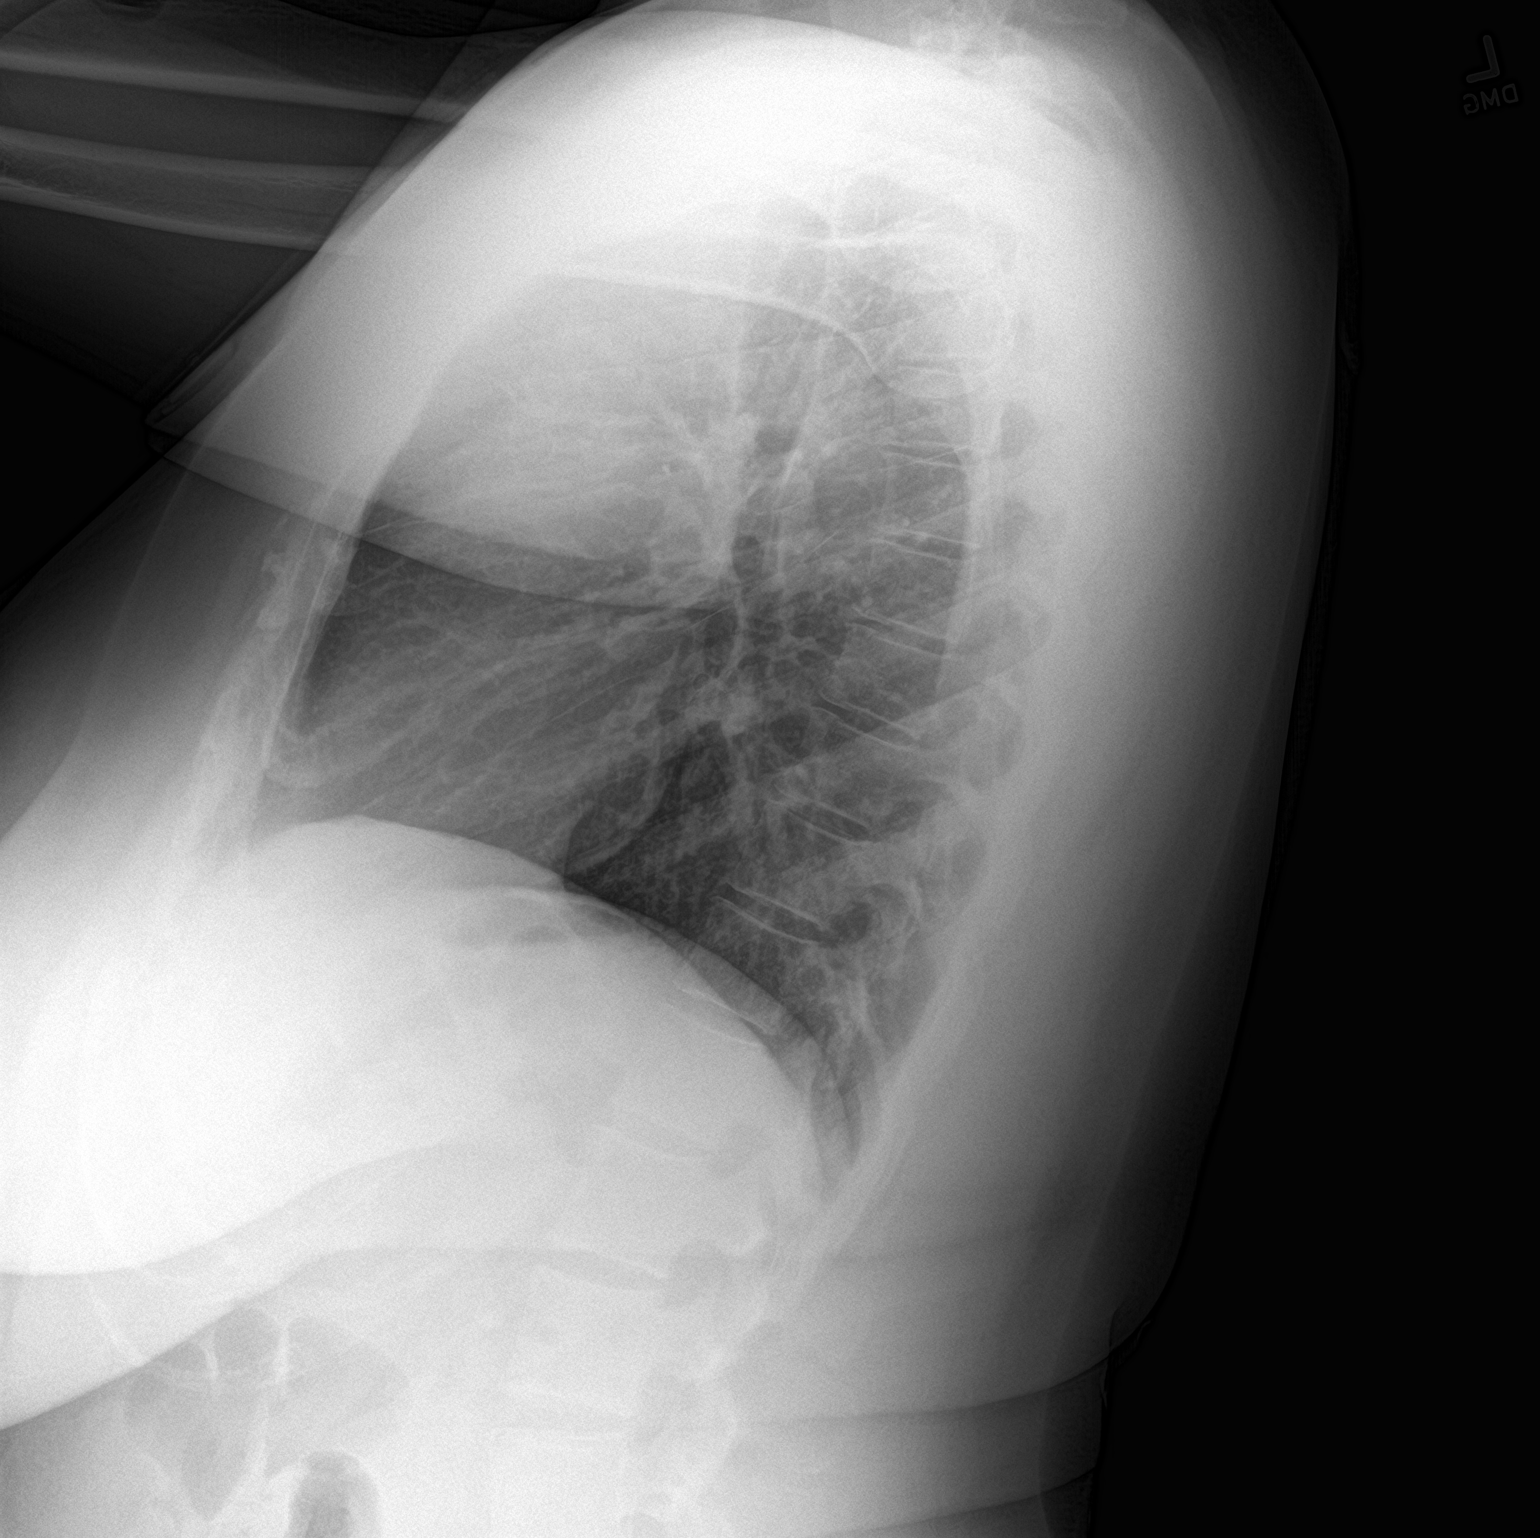

[2 of 2 positions shown; findings below may reference images not displayed]

FINDINGS: The heart size and mediastinal contours are within normal limits. No
focal consolidation. No pleural effusion. No pneumothorax. The
visualized skeletal structures are unremarkable.
IMPRESSION: No active cardiopulmonary disease.

## 2022-06-18 ENCOUNTER — Other Ambulatory Visit (HOSPITAL_COMMUNITY): Payer: Self-pay

## 2022-06-21 ENCOUNTER — Other Ambulatory Visit (HOSPITAL_COMMUNITY): Payer: Self-pay

## 2022-06-22 ENCOUNTER — Telehealth: Payer: Self-pay | Admitting: Nurse Practitioner

## 2022-06-22 ENCOUNTER — Other Ambulatory Visit (HOSPITAL_COMMUNITY): Payer: Self-pay

## 2022-06-22 NOTE — Telephone Encounter (Signed)
Pt called and stated that she is on VIENVA 0.1-20 MG-MCG tablet but she takes the active pills continuously but skips the placebo

## 2022-06-22 NOTE — Telephone Encounter (Signed)
Called & LVM , adv pt to call back to verify what this message indicates.

## 2022-06-23 NOTE — Telephone Encounter (Signed)
Second attempt, LVM

## 2022-07-14 ENCOUNTER — Other Ambulatory Visit (HOSPITAL_COMMUNITY): Payer: Self-pay

## 2022-07-16 ENCOUNTER — Telehealth: Payer: No Typology Code available for payment source | Admitting: Family Medicine

## 2022-07-16 ENCOUNTER — Other Ambulatory Visit (HOSPITAL_COMMUNITY): Payer: Self-pay

## 2022-07-16 DIAGNOSIS — M549 Dorsalgia, unspecified: Secondary | ICD-10-CM | POA: Diagnosis not present

## 2022-07-16 MED ORDER — CYCLOBENZAPRINE HCL 10 MG PO TABS
10.0000 mg | ORAL_TABLET | Freq: Three times a day (TID) | ORAL | 0 refills | Status: DC | PRN
  Filled 2022-07-16: qty 30, 10d supply, fill #0

## 2022-07-16 NOTE — Progress Notes (Signed)

## 2022-07-28 ENCOUNTER — Ambulatory Visit: Payer: No Typology Code available for payment source | Admitting: Nurse Practitioner

## 2022-08-03 ENCOUNTER — Other Ambulatory Visit: Payer: Self-pay | Admitting: Nurse Practitioner

## 2022-08-03 ENCOUNTER — Other Ambulatory Visit (HOSPITAL_COMMUNITY): Payer: Self-pay

## 2022-08-03 ENCOUNTER — Encounter: Payer: Self-pay | Admitting: Nurse Practitioner

## 2022-08-03 ENCOUNTER — Ambulatory Visit (INDEPENDENT_AMBULATORY_CARE_PROVIDER_SITE_OTHER): Payer: No Typology Code available for payment source | Admitting: Nurse Practitioner

## 2022-08-03 VITALS — BP 122/80 | HR 85 | Temp 96.9°F | Ht 68.0 in | Wt 194.6 lb

## 2022-08-03 DIAGNOSIS — R739 Hyperglycemia, unspecified: Secondary | ICD-10-CM

## 2022-08-03 DIAGNOSIS — E663 Overweight: Secondary | ICD-10-CM

## 2022-08-03 DIAGNOSIS — R7989 Other specified abnormal findings of blood chemistry: Secondary | ICD-10-CM | POA: Diagnosis not present

## 2022-08-03 DIAGNOSIS — Z23 Encounter for immunization: Secondary | ICD-10-CM

## 2022-08-03 DIAGNOSIS — E669 Obesity, unspecified: Secondary | ICD-10-CM

## 2022-08-03 DIAGNOSIS — Z3041 Encounter for surveillance of contraceptive pills: Secondary | ICD-10-CM | POA: Diagnosis not present

## 2022-08-03 LAB — BASIC METABOLIC PANEL
BUN: 7 mg/dL (ref 6–23)
CO2: 23 mEq/L (ref 19–32)
Calcium: 9.4 mg/dL (ref 8.4–10.5)
Chloride: 103 mEq/L (ref 96–112)
Creatinine, Ser: 1.28 mg/dL — ABNORMAL HIGH (ref 0.40–1.20)
GFR: 55.04 mL/min — ABNORMAL LOW (ref >60.00)
Glucose, Bld: 82 mg/dL (ref 70–99)
Potassium: 4.1 mEq/L (ref 3.5–5.1)
Sodium: 137 mEq/L (ref 135–145)

## 2022-08-03 LAB — HEMOGLOBIN A1C: Hgb A1c MFr Bld: 5.2 % (ref 4.6–6.5)

## 2022-08-03 MED ORDER — WEGOVY 1.7 MG/0.75ML ~~LOC~~ SOAJ
1.7000 mg | SUBCUTANEOUS | 1 refills | Status: DC
  Filled 2022-08-03: qty 3, 28d supply, fill #0
  Filled 2022-08-10: qty 3, 28d supply, fill #1

## 2022-08-03 MED ORDER — WEGOVY 2.4 MG/0.75ML ~~LOC~~ SOAJ
2.4000 mg | SUBCUTANEOUS | 1 refills | Status: DC
  Filled 2022-08-03: qty 3, 28d supply, fill #0

## 2022-08-03 NOTE — Assessment & Plan Note (Signed)
Intermittent vaginal bleed: every other day, blood stain on tissue. She is not sexually active, no pelvic pain or vaginal odor/discharge, no ABD/back pain. Use of oral contraception: takes only hormonal pills continuously. Advised to start taking non hormonal pills every 8months.

## 2022-08-03 NOTE — Progress Notes (Signed)
Established Patient Visit  Patient: Maria Wiggins   DOB: August 17, 1991   31 y.o. Female  MRN: [id number] Visit Date: 08/03/2022  Subjective:    Chief Complaint  Patient presents with   Office Visit    Weight management f/u Spotting on & off for two weeks  No other concerns  Flu shot given today    HPI Encounter for surveillance of contraceptive pills Intermittent vaginal bleed: every other day, blood stain on tissue. She is not sexually active, no pelvic pain or vaginal odor/discharge, no ABD/back pain. Use of oral contraception: takes only hormonal pills continuously. Advised to start taking non hormonal pills every 27months.  Overweight (BMI 25.0-29.9) Lost 24lbs in last 20months Lost total of 47lbs in last 77months Denies any adverse effects with wegovy 2.4mg  Wt Readings from Last 3 Encounters:  08/03/22 194 lb 9.6 oz (88.3 kg)  05/28/22 218 lb 3.2 oz (99 kg)  04/23/22 225 lb 3.2 oz (102.2 kg)   Mild decline in renal function Repeat BMP: no improvement Decreased wegovy dose to 1.7mg  weekly Repeat BMP in 49month.    Latest Ref Rng & Units 08/03/2022    8:36 AM 05/28/2022    8:22 AM 10/20/2021    8:31 AM  BMP  Glucose 70 - 99 mg/dL 82  90  84   BUN 6 - 23 mg/dL 7  10  7    Creatinine 0.40 - 1.20 mg/dL 1.28  1.26  1.13   Sodium 135 - 145 mEq/L 137  138  136   Potassium 3.5 - 5.1 mEq/L 4.1  5.2 No hemolysis seen  4.5   Chloride 96 - 112 mEq/L 103  107  106   CO2 19 - 32 mEq/L 23  24  22    Calcium 8.4 - 10.5 mg/dL 9.4  9.2  9.2    Wt Readings from Last 3 Encounters:  08/03/22 194 lb 9.6 oz (88.3 kg)  05/28/22 218 lb 3.2 oz (99 kg)  04/23/22 225 lb 3.2 oz (102.2 kg)     Reviewed medical, surgical, and social history today  Medications: Outpatient Medications Prior to Visit  Medication Sig   buPROPion (WELLBUTRIN XL) 150 MG 24 hr tablet Take 1 tablet (150 mg total) by mouth daily.   cyclobenzaprine (FLEXERIL) 10 MG tablet Take 1 tablet (10 mg total) by mouth  3 (three) times daily as needed for muscle spasms.   Insulin Pen Needle 31G X 6 MM MISC Use as directed with Saxenda   Omega-3 Fatty Acids (FISH OIL PO) Take by mouth.   Propylene Glycol (SYSTANE BALANCE) 0.6 % SOLN Apply 1 drop to eye as needed.   VIENVA 0.1-20 MG-MCG tablet Take 1 tablet by mouth daily.   [DISCONTINUED] Semaglutide-Weight Management (WEGOVY) 2.4 MG/0.75ML SOAJ Inject 2.4 mg into the skin once a week.   No facility-administered medications prior to visit.   Reviewed past medical and social history.   ROS per HPI above      Objective:  BP 122/80 (BP Location: Right Arm, Patient Position: Sitting, Cuff Size: Normal)   Pulse 85   Temp (!) 96.9 F (36.1 C) (Temporal)   Ht 5\' 8"  (1.727 m)   Wt 194 lb 9.6 oz (88.3 kg)   SpO2 (!) 85%   BMI 29.59 kg/m      Physical Exam Cardiovascular:     Rate and Rhythm: Normal rate.     Pulses: Normal pulses.  Pulmonary:     Effort: Pulmonary effort is normal.  Musculoskeletal:     Right lower leg: No edema.     Left lower leg: No edema.  Neurological:     Mental Status: She is alert and oriented to person, place, and time.  Psychiatric:        Mood and Affect: Mood normal.        Behavior: Behavior normal.        Thought Content: Thought content normal.     Results for orders placed or performed in visit on 08/03/22  Basic metabolic panel  Result Value Ref Range   Sodium 137 135 - 145 mEq/L   Potassium 4.1 3.5 - 5.1 mEq/L   Chloride 103 96 - 112 mEq/L   CO2 23 19 - 32 mEq/L   Glucose, Bld 82 70 - 99 mg/dL   BUN 7 6 - 23 mg/dL   Creatinine, Ser 1.61 (H) 0.40 - 1.20 mg/dL   GFR 09.60 (L) >45.40 mL/min   Calcium 9.4 8.4 - 10.5 mg/dL  Hemoglobin J8J  Result Value Ref Range   Hgb A1c MFr Bld 5.2 4.6 - 6.5 %      Assessment & Plan:    Problem List Items Addressed This Visit       Other   Encounter for surveillance of contraceptive pills    Intermittent vaginal bleed: every other day, blood stain on tissue.  She is not sexually active, no pelvic pain or vaginal odor/discharge, no ABD/back pain. Use of oral contraception: takes only hormonal pills continuously. Advised to start taking non hormonal pills every 100months.      Overweight (BMI 25.0-29.9) - Primary    Lost 24lbs in last 82months Lost total of 47lbs in last 34months Denies any adverse effects with wegovy 2.4mg  Wt Readings from Last 3 Encounters:  08/03/22 194 lb 9.6 oz (88.3 kg)  05/28/22 218 lb 3.2 oz (99 kg)  04/23/22 225 lb 3.2 oz (102.2 kg)   Mild decline in renal function Repeat BMP: no improvement Decreased wegovy dose to 1.7mg  weekly Repeat BMP in 16month.    Latest Ref Rng & Units 08/03/2022    8:36 AM 05/28/2022    8:22 AM 10/20/2021    8:31 AM  BMP  Glucose 70 - 99 mg/dL 82  90  84   BUN 6 - 23 mg/dL 7  10  7    Creatinine 0.40 - 1.20 mg/dL  1.91  4.78   Sodium 135 - 145 mEq/L 137  138  136   Potassium 3.5 - 5.1 mEq/L 4.1  5.2 No hemolysis seen  4.5   Chloride 96 - 112 mEq/L 103  107  106   CO2 19 - 32 mEq/L 23  24  22    Calcium 8.4 - 10.5 mg/dL 9.4  9.2  9.2         Relevant Medications   Semaglutide-Weight Management (WEGOVY) 1.7 MG/0.75ML SOAJ   Other Relevant Orders   Basic metabolic panel (Completed)   Hemoglobin A1c (Completed)   Proinsulin/insulin ratio   Other Visit Diagnoses     Hyperglycemia       Relevant Orders   Basic metabolic panel (Completed)   Hemoglobin A1c (Completed)   Proinsulin/insulin ratio   Need for vaccination for H flu type B       Relevant Orders   Flu Vaccine QUAD 6+ mos PF IM (Fluarix Quad PF) (Completed)      Return in about 5 months (around  01/03/2023) for CPE (fasting).     Alysia Penna, NP

## 2022-08-03 NOTE — Assessment & Plan Note (Addendum)
Lost 24lbs in last 44months Lost total of 47lbs in last 17months Denies any adverse effects with wegovy 2.4mg  Wt Readings from Last 3 Encounters:  08/03/22 194 lb 9.6 oz (88.3 kg)  05/28/22 218 lb 3.2 oz (99 kg)  04/23/22 225 lb 3.2 oz (102.2 kg)   Mild decline in renal function Repeat BMP: no improvement Decreased wegovy dose to 1.7mg  weekly Repeat BMP in 29month.    Latest Ref Rng & Units 08/03/2022    8:36 AM 05/28/2022    8:22 AM 10/20/2021    8:31 AM  BMP  Glucose 70 - 99 mg/dL 82  90  84   BUN 6 - 23 mg/dL 7  10  7    Creatinine 0.40 - 1.20 mg/dL  9.57  4.73   Sodium 135 - 145 mEq/L 137  138  136   Potassium 3.5 - 5.1 mEq/L 4.1  5.2 No hemolysis seen  4.5   Chloride 96 - 112 mEq/L 103  107  106   CO2 19 - 32 mEq/L 23  24  22    Calcium 8.4 - 10.5 mg/dL 9.4  9.2  9.2

## 2022-08-03 NOTE — Patient Instructions (Signed)
Maintain med dose Go to lab 

## 2022-08-10 ENCOUNTER — Other Ambulatory Visit (HOSPITAL_COMMUNITY): Payer: Self-pay

## 2022-08-12 LAB — PROINSULIN/INSULIN RATIO
Insulin: 6.2 u[IU]/mL
Proinsulin: 3.9 pmol/L

## 2022-08-20 ENCOUNTER — Other Ambulatory Visit (HOSPITAL_COMMUNITY): Payer: Self-pay

## 2022-08-30 ENCOUNTER — Other Ambulatory Visit: Payer: Self-pay | Admitting: Nurse Practitioner

## 2022-08-30 ENCOUNTER — Other Ambulatory Visit (HOSPITAL_COMMUNITY): Payer: Self-pay

## 2022-08-30 DIAGNOSIS — E663 Overweight: Secondary | ICD-10-CM

## 2022-08-30 MED ORDER — WEGOVY 1.7 MG/0.75ML ~~LOC~~ SOAJ
1.7000 mg | SUBCUTANEOUS | 1 refills | Status: DC
  Filled 2022-08-30 (×2): qty 3, 28d supply, fill #0
  Filled 2022-09-27: qty 3, 28d supply, fill #1

## 2022-08-30 NOTE — Telephone Encounter (Signed)
Chart supports Rx Last OV: 07/2022 Next OV: 08/2022  

## 2022-09-02 ENCOUNTER — Telehealth: Payer: Self-pay

## 2022-09-02 NOTE — Telephone Encounter (Signed)
Called pt to verify what kind of appt is needed. She has made it via MyChart & says she only needs labs. Called & left pt a VM needing to verify if visit is for labs or if she has any concerns for [place].

## 2022-09-03 DIAGNOSIS — R7989 Other specified abnormal findings of blood chemistry: Secondary | ICD-10-CM

## 2022-09-03 LAB — BASIC METABOLIC PANEL
BUN: 7 mg/dL (ref 6–23)
CO2: 25 mEq/L (ref 19–32)
Calcium: 9.1 mg/dL (ref 8.4–10.5)
Chloride: 104 mEq/L (ref 96–112)
Creatinine, Ser: 1.16 mg/dL (ref 0.40–1.20)
GFR: 61.9 mL/min (ref >60.00)
Glucose, Bld: 81 mg/dL (ref 70–99)
Potassium: 3.8 mEq/L (ref 3.5–5.1)
Sodium: 136 mEq/L (ref 135–145)

## 2022-09-03 NOTE — Progress Notes (Addendum)
Error. Pt needed lab appt only

## 2022-09-09 ENCOUNTER — Other Ambulatory Visit (HOSPITAL_COMMUNITY): Payer: Self-pay

## 2022-09-13 ENCOUNTER — Telehealth: Payer: Self-pay

## 2022-09-13 NOTE — Telephone Encounter (Signed)
It removed from my inbasket now.

## 2022-09-13 NOTE — Telephone Encounter (Signed)
Trying to close/ sign encounter. Pt only needed a lab appt this day and didn't need the OV. It will not let me sign off. Please advise

## 2022-09-27 ENCOUNTER — Other Ambulatory Visit (HOSPITAL_COMMUNITY): Payer: Self-pay

## 2022-09-28 ENCOUNTER — Other Ambulatory Visit (HOSPITAL_COMMUNITY): Payer: Self-pay

## 2022-09-29 ENCOUNTER — Other Ambulatory Visit (HOSPITAL_COMMUNITY): Payer: Self-pay

## 2022-09-30 ENCOUNTER — Other Ambulatory Visit (HOSPITAL_COMMUNITY): Payer: Self-pay

## 2022-10-01 ENCOUNTER — Other Ambulatory Visit (HOSPITAL_COMMUNITY): Payer: Self-pay

## 2022-10-04 ENCOUNTER — Other Ambulatory Visit (HOSPITAL_COMMUNITY): Payer: Self-pay

## 2022-10-22 ENCOUNTER — Other Ambulatory Visit: Payer: Self-pay | Admitting: Nurse Practitioner

## 2022-10-22 DIAGNOSIS — E663 Overweight: Secondary | ICD-10-CM

## 2022-10-23 ENCOUNTER — Other Ambulatory Visit (HOSPITAL_COMMUNITY): Payer: Self-pay

## 2022-10-25 ENCOUNTER — Other Ambulatory Visit (HOSPITAL_COMMUNITY): Payer: Self-pay

## 2022-10-25 MED ORDER — WEGOVY 1.7 MG/0.75ML ~~LOC~~ SOAJ
1.7000 mg | SUBCUTANEOUS | 2 refills | Status: DC
  Filled 2022-10-25: qty 3, 28d supply, fill #0
  Filled 2022-11-19 – 2022-11-26 (×2): qty 3, 28d supply, fill #1
  Filled 2023-01-14: qty 3, 28d supply, fill #2

## 2022-11-23 ENCOUNTER — Other Ambulatory Visit: Payer: Self-pay

## 2022-11-23 ENCOUNTER — Other Ambulatory Visit (HOSPITAL_COMMUNITY): Payer: Self-pay

## 2022-11-25 ENCOUNTER — Telehealth: Payer: Self-pay | Admitting: Nurse Practitioner

## 2022-11-25 NOTE — Telephone Encounter (Signed)
I will let Claris Gower decide on this, as her kidney function had been abnormal and that was the reason for decrease in dose.

## 2022-11-25 NOTE — Telephone Encounter (Signed)
Caller Name: Ixchel Duck Call back phone #: [phone]  Reason for Call: Pt is asking for next dose of Wegovy to be sent to pharmacy  [facility] [address], [place] [address] Phone: [phone]  Fax: [phone]

## 2022-11-30 ENCOUNTER — Encounter: Payer: Self-pay | Admitting: Nurse Practitioner

## 2022-11-30 ENCOUNTER — Other Ambulatory Visit (HOSPITAL_COMMUNITY): Payer: Self-pay

## 2022-11-30 NOTE — Telephone Encounter (Signed)
Pt advised via detailed VM 

## 2022-12-02 ENCOUNTER — Telehealth: Payer: Self-pay

## 2022-12-02 ENCOUNTER — Other Ambulatory Visit (HOSPITAL_COMMUNITY): Payer: Self-pay

## 2022-12-02 NOTE — Telephone Encounter (Signed)
Submitted prior authorization for Wegovy 1.7 mg/0.75 ml. Documented in separate encounter.

## 2022-12-02 NOTE — Telephone Encounter (Signed)
Patient Advocate Encounter   Received notification from [employer] that prior authorization for [facility] is required.   PA submitted on 12/02/2021 Key [id number] Status is pending

## 2022-12-07 ENCOUNTER — Other Ambulatory Visit (HOSPITAL_COMMUNITY): Payer: Self-pay

## 2022-12-07 ENCOUNTER — Telehealth: Payer: Self-pay | Admitting: Nurse Practitioner

## 2022-12-07 NOTE — Telephone Encounter (Signed)
Pt called and stated that she been waiting for a prior authorization for wygovy and pt have not heard anything yet for this medication

## 2022-12-08 ENCOUNTER — Other Ambulatory Visit: Payer: Self-pay

## 2022-12-10 ENCOUNTER — Other Ambulatory Visit (HOSPITAL_COMMUNITY): Payer: Self-pay

## 2022-12-13 ENCOUNTER — Encounter: Payer: Self-pay | Admitting: Nurse Practitioner

## 2022-12-14 ENCOUNTER — Telehealth: Payer: Self-pay | Admitting: Nurse Practitioner

## 2022-12-14 NOTE — Telephone Encounter (Signed)
Wygovy need verbal authorization please call the pt

## 2022-12-15 ENCOUNTER — Other Ambulatory Visit (HOSPITAL_COMMUNITY): Payer: Self-pay

## 2022-12-16 ENCOUNTER — Other Ambulatory Visit: Payer: Self-pay

## 2022-12-16 NOTE — Telephone Encounter (Signed)
Insurance needed some further information, faxed the form to them today.

## 2022-12-16 NOTE — Telephone Encounter (Signed)
Faxed additional info request to [place]

## 2022-12-17 ENCOUNTER — Other Ambulatory Visit (HOSPITAL_COMMUNITY): Payer: Self-pay

## 2022-12-17 ENCOUNTER — Other Ambulatory Visit: Payer: Self-pay | Admitting: Nurse Practitioner

## 2022-12-17 ENCOUNTER — Other Ambulatory Visit: Payer: Self-pay

## 2022-12-17 DIAGNOSIS — Z3041 Encounter for surveillance of contraceptive pills: Secondary | ICD-10-CM

## 2022-12-17 NOTE — Telephone Encounter (Signed)
Patient Advocate Encounter  Prior Authorization for Wegovy 1.7MG /0.75ML PEN has been approved.     Effective: 12-16-2022 to 12-16-2023

## 2022-12-20 ENCOUNTER — Other Ambulatory Visit (HOSPITAL_COMMUNITY): Payer: Self-pay

## 2022-12-20 ENCOUNTER — Other Ambulatory Visit: Payer: Self-pay

## 2022-12-20 MED ORDER — VIENVA 0.1-20 MG-MCG PO TABS
1.0000 | ORAL_TABLET | Freq: Every day | ORAL | 3 refills | Status: DC
  Filled 2022-12-20: qty 84, 63d supply, fill #0

## 2022-12-20 NOTE — Telephone Encounter (Signed)
PA has been approved in previous encounter

## 2022-12-20 NOTE — Telephone Encounter (Signed)
Chart supports Rx Last OV: 08/2022 Next OV: 12/2022  

## 2022-12-21 ENCOUNTER — Other Ambulatory Visit (HOSPITAL_COMMUNITY): Payer: Self-pay

## 2022-12-22 ENCOUNTER — Other Ambulatory Visit (HOSPITAL_COMMUNITY): Payer: Self-pay

## 2023-01-05 ENCOUNTER — Encounter: Payer: Self-pay | Admitting: Nurse Practitioner

## 2023-01-05 ENCOUNTER — Other Ambulatory Visit (HOSPITAL_COMMUNITY): Payer: Self-pay

## 2023-01-05 ENCOUNTER — Ambulatory Visit (INDEPENDENT_AMBULATORY_CARE_PROVIDER_SITE_OTHER): Payer: 59 | Admitting: Nurse Practitioner

## 2023-01-05 VITALS — BP 112/76 | HR 94 | Temp 98.0°F | Resp 16 | Ht 60.0 in | Wt 169.4 lb

## 2023-01-05 DIAGNOSIS — Z13220 Encounter for screening for lipoid disorders: Secondary | ICD-10-CM

## 2023-01-05 DIAGNOSIS — Z0001 Encounter for general adult medical examination with abnormal findings: Secondary | ICD-10-CM | POA: Diagnosis not present

## 2023-01-05 DIAGNOSIS — F3342 Major depressive disorder, recurrent, in full remission: Secondary | ICD-10-CM | POA: Diagnosis not present

## 2023-01-05 DIAGNOSIS — Z6833 Body mass index (BMI) 33.0-33.9, adult: Secondary | ICD-10-CM | POA: Diagnosis not present

## 2023-01-05 DIAGNOSIS — Z136 Encounter for screening for cardiovascular disorders: Secondary | ICD-10-CM

## 2023-01-05 DIAGNOSIS — E66811 Obesity, class 1: Secondary | ICD-10-CM

## 2023-01-05 DIAGNOSIS — E6609 Other obesity due to excess calories: Secondary | ICD-10-CM | POA: Diagnosis not present

## 2023-01-05 DIAGNOSIS — F418 Other specified anxiety disorders: Secondary | ICD-10-CM

## 2023-01-05 MED ORDER — HYDROXYZINE HCL 10 MG PO TABS
10.0000 mg | ORAL_TABLET | Freq: Three times a day (TID) | ORAL | 0 refills | Status: DC | PRN
  Filled 2023-01-05 – 2023-01-17 (×2): qty 30, 10d supply, fill #0

## 2023-01-05 NOTE — Assessment & Plan Note (Signed)
25lbs weight loss in last 94months Total weight loss since 06/2021: 77lbs Started with phentermine, then switched to saxenda due to adverse effects of phentermine. Switched saxenda to [facility] 12/2021 due to lack progression in weight loss. No adverse with current dose of wegovy 1.7mg  Diet: has maintain high protein, high fiber and low fat diet Exercise: continues cardio 20-24mins 3x/week and weight training 2x/week Wt Readings from Last 3 Encounters:  01/05/23 169 lb 6.4 oz (76.8 kg)  08/03/22 194 lb 9.6 oz (88.3 kg)  05/28/22 218 lb 3.2 oz (99 kg)    Maintain med dose F/up in 72months

## 2023-01-05 NOTE — Assessment & Plan Note (Addendum)
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 She is compliance with wellbutrin dose. No SI/HI/hallucination No ETOH use, no illicit drug use

## 2023-01-05 NOTE — Progress Notes (Signed)
Complete physical exam  Patient: Maria Wiggins   DOB: Dec 22, 1990   32 y.o. Female  MRN: [id number] Visit Date: 01/05/2023  Subjective:    Chief Complaint  Patient presents with   Annual Exam    Not fasting    Maria Wiggins is a 32 y.o. female who presents today for a complete physical exam. She reports consuming a general diet.  Home exercise: cardio 20-45mins 3x/week, weight training 2x/week.  She generally feels well. She reports sleeping well. She does have additional problems to discuss today.  Vision:Yes Dental:Yes STD Screen:Yes  BP Readings from Last 3 Encounters:  01/05/23 112/76  08/03/22 122/80  05/28/22 118/78   Wt Readings from Last 3 Encounters:  01/05/23 169 lb 6.4 oz (76.8 kg)  08/03/22 194 lb 9.6 oz (88.3 kg)  05/28/22 218 lb 3.2 oz (99 kg)   Most recent fall risk assessment:    01/05/2023    9:21 AM  Fall Risk   Falls in the past year? 0  Number falls in past yr: 0  Injury with Fall? 0   Depression screen:Yes - Depression  Most recent depression screenings:    01/05/2023   10:14 AM 01/05/2023    9:22 AM  PHQ 2/9 Scores  PHQ - 2 Score 0 0  PHQ- 9 Score 1       01/05/2023   10:14 AM 01/05/2023    9:22 AM 04/23/2022    9:25 AM  Depression screen PHQ 2/9  Decreased Interest 0 0 0  Down, Depressed, Hopeless 0 0 0  PHQ - 2 Score 0 0 0  Altered sleeping 1  0  Tired, decreased energy 0  0  Change in appetite 0  0  Feeling bad or failure about yourself  0  0  Trouble concentrating 0  0  Moving slowly or fidgety/restless 0  0  Suicidal thoughts 0  0  PHQ-9 Score 1  0  Difficult doing work/chores   Not difficult at all       01/05/2023   10:15 AM 04/23/2022    9:25 AM 01/22/2022    8:31 AM 10/20/2021    8:37 AM  GAD 7 : Generalized Anxiety Score  Nervous, Anxious, on Edge 3 0 0 1  Control/stop worrying 1 0 0 1  Worry too much - different things 1 0 0 1  Trouble relaxing 2 0 0 0  Restless 0 0 0 0  Easily annoyed or irritable 1 0 0 1  Afraid - awful  might happen 0 0 0 1  Total GAD 7 Score 8 0 0 5  Anxiety Difficulty Somewhat difficult Not difficult at all  Not difficult at all   HPI  Depression, recurrent (HCC) Reports increased irritability and restlessness 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 She is compliance with wellbutrin dose. No SI/HI/hallucination No ETOH use, no illicit drug use  Obese 25lbs weight loss in last 1months Total weight loss since 06/2021: 77lbs Started with phentermine, then switched to saxenda due to adverse effects of phentermine. Switched saxenda to [facility] 12/2021 due to lack progression in weight loss. No adverse with current dose of wegovy 1.7mg  Diet: has maintain high protein, high fiber and low fat diet Exercise: continues cardio 20-3mins 3x/week and weight training 2x/week Wt Readings from Last 3 Encounters:  01/05/23 169 lb 6.4 oz (76.8 kg)  08/03/22 194 lb 9.6 oz (88.3 kg)  05/28/22 218 lb 3.2 oz (99 kg)    Maintain  med dose F/up in 66months  Past Medical History:  Diagnosis Date   Anxiety    History reviewed. No pertinent surgical history. Social History   Socioeconomic History   Marital status: Single    Spouse name: Not on file   Number of children: 1   Years of education: Not on file   Highest education level: Not on file  Occupational History   Not on file  Tobacco Use   Smoking status: Never   Smokeless tobacco: Never  Vaping Use   Vaping Use: Never used  Substance and Sexual Activity   Alcohol use: Not Currently   Drug use: Never   Sexual activity: Not Currently  Other Topics Concern   Not on file  Social History Narrative   Not on file   Social Determinants of Health   Financial Resource Strain: Not on file  Food Insecurity: Not on file  Transportation Needs: Not on file  Physical Activity: Not on file  Stress: Not on file  Social Connections: Not on file  Intimate Partner Violence: Not on file   Family Status   Relation Name Status   Mother  Alive   Father  Alive   Family History  Problem Relation Age of Onset   Diabetes Father    No Known Allergies  Patient Care Team: Soila Printup, Charlene Brooke, NP as PCP - General (Internal Medicine)   Medications: Outpatient Medications Prior to Visit  Medication Sig   Omega-3 Fatty Acids (FISH OIL PO) Take by mouth.   Propylene Glycol (SYSTANE BALANCE) 0.6 % SOLN Apply 1 drop to eye as needed.   Semaglutide-Weight Management (WEGOVY) 1.7 MG/0.75ML SOAJ Inject 1.7 mg into the skin once a week.   VIENVA 0.1-20 MG-MCG tablet Take 1 tablet by mouth daily.   buPROPion (WELLBUTRIN XL) 150 MG 24 hr tablet Take 1 tablet (150 mg total) by mouth daily.   [DISCONTINUED] cyclobenzaprine (FLEXERIL) 10 MG tablet Take 1 tablet (10 mg total) by mouth 3 (three) times daily as needed for muscle spasms. (Patient not taking: Reported on 01/05/2023)   [DISCONTINUED] Insulin Pen Needle 31G X 6 MM MISC Use as directed with Saxenda (Patient not taking: Reported on 01/05/2023)   No facility-administered medications prior to visit.    Review of Systems  Constitutional:  Negative for fever.  HENT:  Negative for congestion and sore throat.   Eyes:        Negative for visual changes  Respiratory:  Negative for cough and shortness of breath.   Cardiovascular:  Negative for chest pain, palpitations and leg swelling.  Gastrointestinal:  Negative for blood in stool, constipation and diarrhea.  Genitourinary:  Negative for dysuria, frequency and urgency.  Musculoskeletal:  Negative for myalgias.  Skin:  Negative for rash.  Neurological:  Negative for dizziness and headaches.  Hematological:  Does not bruise/bleed easily.  Psychiatric/Behavioral:  Positive for agitation. Negative for confusion, decreased concentration, hallucinations, self-injury, sleep disturbance and suicidal ideas. The patient is nervous/anxious. The patient is not hyperactive.        Objective:  BP 112/76 (BP  Location: Left Arm, Patient Position: Sitting, Cuff Size: Normal)   Pulse 94   Temp 98 F (36.7 C) (Temporal)   Resp 16   Ht 5' (1.524 m)   Wt 169 lb 6.4 oz (76.8 kg)   LMP 12/22/2022 (Approximate)   SpO2 99%   BMI 33.08 kg/m     Physical Exam Vitals and nursing note reviewed.  Constitutional:  General: She is not in acute distress. HENT:     Right Ear: Tympanic membrane, ear canal and external ear normal.     Left Ear: Tympanic membrane, ear canal and external ear normal.     Nose: Nose normal.     Mouth/Throat:     Pharynx: No oropharyngeal exudate.  Eyes:     General: No scleral icterus. Cardiovascular:     Rate and Rhythm: Normal rate and regular rhythm.     Pulses: Normal pulses.     Heart sounds: Normal heart sounds.  Pulmonary:     Effort: Pulmonary effort is normal. No respiratory distress.     Breath sounds: Normal breath sounds.  Abdominal:     General: Bowel sounds are normal. There is no distension.     Palpations: Abdomen is soft.  Genitourinary:    Comments: Declined breast and pelvic exam today Musculoskeletal:        General: Normal range of motion.     Cervical back: Normal range of motion and neck supple.     Right lower leg: No edema.     Left lower leg: No edema.  Lymphadenopathy:     Cervical: No cervical adenopathy.  Skin:    General: Skin is warm and dry.  Neurological:     Mental Status: She is alert and oriented to person, place, and time.  Psychiatric:        Mood and Affect: Mood normal.        Behavior: Behavior normal.        Thought Content: Thought content normal.     No results found for any visits on 01/05/23.    Assessment & Plan:    Routine Health Maintenance and Physical Exam  Immunization History  Administered Date(s) Administered   Influenza,inj,Quad PF,6+ Mos 09/08/2021, 08/03/2022   Moderna Sars-Covid-2 Vaccination 03/04/2020, 04/01/2020   Health Maintenance  Topic Date Due   COVID-19 Vaccine (3 - 2023-24  season) 01/21/2023 (Originally 07/30/2022)   PAP SMEAR-Modifier  01/22/2025   INFLUENZA VACCINE  Completed   Hepatitis C Screening  Completed   HIV Screening  Completed   HPV VACCINES  Aged Out   DTaP/Tdap/Td  Discontinued   Discussed health benefits of physical activity, and encouraged her to engage in regular exercise appropriate for her age and condition.  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 She is compliance with wellbutrin dose. No SI/HI/hallucination No ETOH use, no illicit drug use      Relevant Medications   hydrOXYzine (ATARAX) 10 MG tablet   Obese    25lbs weight loss in last 31months Total weight loss since 06/2021: 77lbs Started with phentermine, then switched to saxenda due to adverse effects of phentermine. Switched saxenda to [facility] 12/2021 due to lack progression in weight loss. No adverse with current dose of wegovy 1.7mg  Diet: has maintain high protein, high fiber and low fat diet Exercise: continues cardio 20-71mins 3x/week and weight training 2x/week Wt Readings from Last 3 Encounters:  01/05/23 169 lb 6.4 oz (76.8 kg)  08/03/22 194 lb 9.6 oz (88.3 kg)  05/28/22 218 lb 3.2 oz (99 kg)    Maintain med dose F/up in 33months      Other Visit Diagnoses     Encounter for preventative adult health care exam with abnormal findings    -  Primary   Relevant Orders   Comprehensive metabolic panel   Lipid panel   CBC   Situational anxiety       Relevant Medications   hydrOXYzine (ATARAX) 10 MG tablet   Other Relevant Orders   Ambulatory referral to Psychology   Encounter for lipid screening for cardiovascular disease       Relevant Orders   Lipid panel      Return in about 4 weeks (around 02/02/2023) for depression and anxiety.     Wilfred Lacy, NP

## 2023-01-05 NOTE — Patient Instructions (Addendum)
Schedule fasting lab appt Use vistaril as needed for anxiety  Maintain other med doses  Preventive Care 41-34 Years Old, Female Preventive care refers to lifestyle choices and visits with your health care provider that can promote health and wellness. Preventive care visits are also called wellness exams. What can I expect for my preventive care visit? Counseling During your preventive care visit, your health care provider may ask about your: Medical history, including: Past medical problems. Family medical history. Pregnancy history. Current health, including: Menstrual cycle. Method of birth control. Emotional well-being. Home life and relationship well-being. Sexual activity and sexual health. Lifestyle, including: Alcohol, nicotine or tobacco, and drug use. Access to firearms. Diet, exercise, and sleep habits. Work and work Statistician. Sunscreen use. Safety issues such as seatbelt and bike helmet use. Physical exam Your health care provider may check your: Height and weight. These may be used to calculate your BMI (body mass index). BMI is a measurement that tells if you are at a healthy weight. Waist circumference. This measures the distance around your waistline. This measurement also tells if you are at a healthy weight and may help predict your risk of certain diseases, such as type 2 diabetes and high blood pressure. Heart rate and blood pressure. Body temperature. Skin for abnormal spots. What immunizations do I need?  Vaccines are usually given at various ages, according to a schedule. Your health care provider will recommend vaccines for you based on your age, medical history, and lifestyle or other factors, such as travel or where you work. What tests do I need? Screening Your health care provider may recommend screening tests for certain conditions. This may include: Pelvic exam and Pap test. Lipid and cholesterol levels. Diabetes screening. This is done by  checking your blood sugar (glucose) after you have not eaten for a while (fasting). Hepatitis B test. Hepatitis C test. HIV (human immunodeficiency virus) test. STI (sexually transmitted infection) testing, if you are at risk. BRCA-related cancer screening. This may be done if you have a family history of breast, ovarian, tubal, or peritoneal cancers. Talk with your health care provider about your test results, treatment options, and if necessary, the need for more tests. Follow these instructions at home: Eating and drinking  Eat a healthy diet that includes fresh fruits and vegetables, whole grains, lean protein, and low-fat dairy products. Take vitamin and mineral supplements as recommended by your health care provider. Do not drink alcohol if: Your health care provider tells you not to drink. You are pregnant, may be pregnant, or are planning to become pregnant. If you drink alcohol: Limit how much you have to 0-1 drink a day. Know how much alcohol is in your drink. In the [place] one drink equals one 12 oz bottle of beer (355 mL), one 5 oz glass of wine (148 mL), or one 1 oz glass of hard liquor (44 mL). Lifestyle Brush your teeth every morning and night with fluoride toothpaste. Floss one time each day. Exercise for at least 30 minutes 5 or more days each week. Do not use any products that contain nicotine or tobacco. These products include cigarettes, chewing tobacco, and vaping devices, such as e-cigarettes. If you need help quitting, ask your health care provider. Do not use drugs. If you are sexually active, practice safe sex. Use a condom or other form of protection to prevent STIs. If you do not wish to become pregnant, use a form of birth control. If you plan to become pregnant, see your  health care provider for a prepregnancy visit. Find healthy ways to manage stress, such as: Meditation, yoga, or listening to music. Journaling. Talking to a trusted person. Spending time  with friends and family. Minimize exposure to UV radiation to reduce your risk of skin cancer. Safety Always wear your seat belt while driving or riding in a vehicle. Do not drive: If you have been drinking alcohol. Do not ride with someone who has been drinking. If you have been using any mind-altering substances or drugs. While texting. When you are tired or distracted. Wear a helmet and other protective equipment during sports activities. If you have firearms in your house, make sure you follow all gun safety procedures. Seek help if you have been physically or sexually abused. What's next? Go to your health care provider once a year for an annual wellness visit. Ask your health care provider how often you should have your eyes and teeth checked. Stay up to date on all vaccines. This information is not intended to replace advice given to you by your health care provider. Make sure you discuss any questions you have with your health care provider. Document Revised: 05/13/2021 Document Reviewed: 05/13/2021 Elsevier Patient Education  [place].

## 2023-01-14 ENCOUNTER — Other Ambulatory Visit (HOSPITAL_COMMUNITY): Payer: Self-pay

## 2023-01-14 ENCOUNTER — Other Ambulatory Visit (INDEPENDENT_AMBULATORY_CARE_PROVIDER_SITE_OTHER): Payer: 59

## 2023-01-14 DIAGNOSIS — Z13220 Encounter for screening for lipoid disorders: Secondary | ICD-10-CM | POA: Diagnosis not present

## 2023-01-14 DIAGNOSIS — E669 Obesity, unspecified: Secondary | ICD-10-CM

## 2023-01-14 DIAGNOSIS — Z136 Encounter for screening for cardiovascular disorders: Secondary | ICD-10-CM

## 2023-01-14 LAB — LIPID PANEL
Cholesterol: 154 mg/dL (ref 0–200)
HDL: 72 mg/dL (ref >39.00)
LDL Cholesterol: 74 mg/dL (ref 0–99)
NonHDL: 81.94
Total CHOL/HDL Ratio: 2
Triglycerides: 42 mg/dL (ref 0.0–149.0)
VLDL: 8.4 mg/dL (ref 0.0–40.0)

## 2023-01-14 NOTE — Progress Notes (Signed)
Labs

## 2023-01-14 NOTE — Progress Notes (Signed)
Stable Follow instructions as discussed during office visit.

## 2023-01-15 ENCOUNTER — Other Ambulatory Visit (HOSPITAL_COMMUNITY): Payer: Self-pay

## 2023-01-17 ENCOUNTER — Other Ambulatory Visit (HOSPITAL_COMMUNITY): Payer: Self-pay
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
## 2023-01-22 ENCOUNTER — Ambulatory Visit (HOSPITAL_BASED_OUTPATIENT_CLINIC_OR_DEPARTMENT_OTHER): Payer: 59 | Admitting: Psychiatry

## 2023-01-22 ENCOUNTER — Other Ambulatory Visit (HOSPITAL_COMMUNITY): Payer: Self-pay

## 2023-01-22 ENCOUNTER — Encounter (HOSPITAL_COMMUNITY): Payer: Self-pay | Admitting: Psychiatry

## 2023-01-22 DIAGNOSIS — F411 Generalized anxiety disorder: Secondary | ICD-10-CM | POA: Diagnosis not present

## 2023-01-22 DIAGNOSIS — F3341 Major depressive disorder, recurrent, in partial remission: Secondary | ICD-10-CM

## 2023-01-22 MED ORDER — BUPROPION HCL ER (XL) 300 MG PO TB24
300.0000 mg | ORAL_TABLET | Freq: Every day | ORAL | 1 refills | Status: DC
  Filled 2023-01-22 – 2023-02-07 (×2): qty 30, 30d supply, fill #0
  Filled 2023-02-18 – 2023-03-19 (×4): qty 30, 30d supply, fill #1

## 2023-01-22 NOTE — Patient Instructions (Signed)
Increase bupropion 300 mg daily  Referral to therapy -[place] Next appointment: 4/25 at 4:30

## 2023-01-24 ENCOUNTER — Other Ambulatory Visit: Payer: Self-pay | Admitting: Nurse Practitioner

## 2023-01-24 ENCOUNTER — Other Ambulatory Visit: Payer: Self-pay

## 2023-01-24 ENCOUNTER — Other Ambulatory Visit (HOSPITAL_COMMUNITY): Payer: Self-pay

## 2023-01-24 DIAGNOSIS — E663 Overweight: Secondary | ICD-10-CM

## 2023-01-24 MED ORDER — WEGOVY 1.7 MG/0.75ML ~~LOC~~ SOAJ
1.7000 mg | SUBCUTANEOUS | 2 refills | Status: DC
  Filled 2023-01-24 – 2023-02-07 (×4): qty 3, 28d supply, fill #0
  Filled 2023-02-18 – 2023-03-02 (×3): qty 3, 28d supply, fill #1
  Filled 2023-03-22 – 2023-03-25 (×2): qty 3, 28d supply, fill #2

## 2023-01-25 ENCOUNTER — Other Ambulatory Visit (HOSPITAL_COMMUNITY): Payer: Self-pay

## 2023-01-26 ENCOUNTER — Other Ambulatory Visit (HOSPITAL_COMMUNITY): Payer: Self-pay

## 2023-01-26 MED ORDER — SEMAGLUTIDE-WEIGHT MANAGEMENT 1 MG/0.5ML ~~LOC~~ SOAJ
SUBCUTANEOUS | 0 refills | Status: DC
  Filled 2023-01-26: qty 2, 28d supply, fill #0

## 2023-01-31 ENCOUNTER — Other Ambulatory Visit (HOSPITAL_COMMUNITY): Payer: Self-pay

## 2023-02-04 ENCOUNTER — Ambulatory Visit: Payer: 59 | Admitting: Nurse Practitioner

## 2023-02-05 ENCOUNTER — Other Ambulatory Visit (HOSPITAL_COMMUNITY): Payer: Self-pay

## 2023-02-07 ENCOUNTER — Other Ambulatory Visit (HOSPITAL_COMMUNITY): Payer: Self-pay

## 2023-02-10 ENCOUNTER — Other Ambulatory Visit: Payer: Self-pay

## 2023-02-10 ENCOUNTER — Other Ambulatory Visit (HOSPITAL_COMMUNITY): Payer: Self-pay

## 2023-02-18 ENCOUNTER — Other Ambulatory Visit: Payer: Self-pay | Admitting: Nurse Practitioner

## 2023-02-18 ENCOUNTER — Other Ambulatory Visit (HOSPITAL_COMMUNITY): Payer: Self-pay

## 2023-02-18 DIAGNOSIS — F418 Other specified anxiety disorders: Secondary | ICD-10-CM

## 2023-02-18 MED ORDER — HYDROXYZINE HCL 10 MG PO TABS
10.0000 mg | ORAL_TABLET | Freq: Three times a day (TID) | ORAL | 0 refills | Status: DC | PRN
  Filled 2023-02-18: qty 30, 10d supply, fill #0

## 2023-02-28 ENCOUNTER — Other Ambulatory Visit (HOSPITAL_COMMUNITY): Payer: Self-pay

## 2023-02-28 ENCOUNTER — Encounter: Payer: Self-pay | Admitting: Nurse Practitioner

## 2023-02-28 ENCOUNTER — Ambulatory Visit (INDEPENDENT_AMBULATORY_CARE_PROVIDER_SITE_OTHER): Payer: 59 | Admitting: Nurse Practitioner

## 2023-02-28 VITALS — BP 110/86 | HR 91 | Temp 98.2°F | Resp 16 | Ht 60.0 in | Wt 167.2 lb

## 2023-02-28 DIAGNOSIS — Z6832 Body mass index (BMI) 32.0-32.9, adult: Secondary | ICD-10-CM

## 2023-02-28 DIAGNOSIS — E6609 Other obesity due to excess calories: Secondary | ICD-10-CM | POA: Diagnosis not present

## 2023-02-28 DIAGNOSIS — F339 Major depressive disorder, recurrent, unspecified: Secondary | ICD-10-CM

## 2023-02-28 DIAGNOSIS — Z3041 Encounter for surveillance of contraceptive pills: Secondary | ICD-10-CM | POA: Diagnosis not present

## 2023-02-28 LAB — POCT URINE PREGNANCY: Preg Test, Ur: NEGATIVE

## 2023-02-28 MED ORDER — MEDROXYPROGESTERONE ACETATE 150 MG/ML IM SUSP
150.0000 mg | INTRAMUSCULAR | Status: DC
Start: 2023-02-28 — End: 2023-02-28

## 2023-02-28 MED ORDER — MEDROXYPROGESTERONE ACETATE 150 MG/ML IM SUSY
150.0000 mg | PREFILLED_SYRINGE | Freq: Once | INTRAMUSCULAR | Status: AC
Start: 2023-02-28 — End: 2023-02-28
  Administered 2023-02-28: 150 mg via INTRAMUSCULAR

## 2023-02-28 NOTE — Assessment & Plan Note (Signed)
Has established care with psychiatry Plans to schedule appt for therapy Improved mood with increase wellbutrin dose, denies any adverse side effects Use of vistaril 2x/week ETOH: 1beer weekly.

## 2023-02-28 NOTE — Progress Notes (Signed)
Established Patient Visit  Patient: Maria Wiggins   DOB: 10-23-1991   32 y.o. Female  MRN: [id number] Visit Date: 02/28/2023  Subjective:    Chief Complaint  Patient presents with   Depression   Obese Unable to use contrave due to current use of wellbutrin Does not like side effects of orlistat. Wants to switch to qsymia once unable to get wegovy in 19month. Wt Readings from Last 3 Encounters:  02/28/23 167 lb 3.2 oz (75.8 kg)  01/05/23 169 lb 6.4 oz (76.8 kg)  08/03/22 194 lb 9.6 oz (88.3 kg)     Depression, recurrent ([place]) Has established care with psychiatry Plans to schedule appt for therapy Improved mood with increase wellbutrin dose, denies any adverse side effects Use of vistaril 2x/week ETOH: 1beer weekly.  Encounter for surveillance of contraceptive pills She opted to switch oral contraception to depoprovera injection due to decreased effectiveness of COC with GLP-1 injection. Advised about possible side effects of depoProvera. She verbalized understanding.   Get urine pregnancy today Start depo injection today Stop COC in 1week  Reviewed medical, surgical, and social history today  Medications: Outpatient Medications Prior to Visit  Medication Sig   buPROPion (WELLBUTRIN XL) 300 MG 24 hr tablet Take 1 tablet (300 mg total) by mouth daily.   hydrOXYzine (ATARAX) 10 MG tablet Take 1 tablet (10 mg total) by mouth 3 (three) times daily as needed.   Omega-3 Fatty Acids (FISH OIL PO) Take by mouth.   Propylene Glycol (SYSTANE BALANCE) 0.6 % SOLN Apply 1 drop to eye as needed.   Semaglutide-Weight Management (WEGOVY) 1.7 MG/0.75ML SOAJ Inject 1.7 mg into the skin once a week.   [DISCONTINUED] VIENVA 0.1-20 MG-MCG tablet Take 1 tablet by mouth daily.   [DISCONTINUED] buPROPion (WELLBUTRIN XL) 150 MG 24 hr tablet Take 1 tablet (150 mg total) by mouth daily. (Patient not taking: Reported on 02/28/2023)   No facility-administered medications prior to visit.    Reviewed past medical and social history.   ROS per HPI above      Objective:  BP 110/86 (BP Location: Left Arm, Patient Position: Sitting, Cuff Size: Normal)   Pulse 91   Temp 98.2 F (36.8 C) (Temporal)   Resp 16   Ht 5' (1.524 m)   Wt 167 lb 3.2 oz (75.8 kg)   LMP 02/22/2023 (Exact Date)   BMI 32.65 kg/m      Physical Exam Vitals and nursing note reviewed.  Cardiovascular:     Rate and Rhythm: Normal rate and regular rhythm.     Pulses: Normal pulses.     Heart sounds: Normal heart sounds.  Pulmonary:     Effort: Pulmonary effort is normal.     Breath sounds: Normal breath sounds.  Neurological:     Mental Status: She is alert and oriented to person, place, and time.     Results for orders placed or performed in visit on 02/28/23  POCT urine pregnancy  Result Value Ref Range   Preg Test, Ur Negative Negative      Assessment & Plan:    Problem List Items Addressed This Visit       Other   Depression, recurrent - Primary    Has established care with psychiatry Plans to schedule appt for therapy Improved mood with increase wellbutrin dose, denies any adverse side effects Use of vistaril 2x/week ETOH: 1beer weekly.      Encounter  for surveillance of contraceptive pills    She opted to switch oral contraception to depoprovera injection due to decreased effectiveness of COC with GLP-1 injection. Advised about possible side effects of depoProvera. She verbalized understanding.   Get urine pregnancy today Start depo injection today Stop COC in 1week      Relevant Medications   medroxyPROGESTERone Acetate SUSY 150 mg   Other Relevant Orders   POCT urine pregnancy (Completed)   Obese    Unable to use contrave due to current use of wellbutrin Does not like side effects of orlistat. Wants to switch to qsymia once unable to get wegovy in 59month. Wt Readings from Last 3 Encounters:  02/28/23 167 lb 3.2 oz (75.8 kg)  01/05/23 169 lb 6.4 oz (76.8 kg)   08/03/22 194 lb 9.6 oz (88.3 kg)         Return in about 8 weeks (around 04/25/2023) for Weight management.     Wilfred Lacy, NP

## 2023-02-28 NOTE — Assessment & Plan Note (Addendum)
She opted to switch oral contraception to depoprovera injection due to decreased effectiveness of COC with GLP-1 injection. Advised about possible side effects of depoProvera. She verbalized understanding.   Get urine pregnancy today Start depo injection today Stop COC in 1week

## 2023-02-28 NOTE — Patient Instructions (Addendum)
Stop oral contraception in 1week Return to office every 90days for depoprovera injection. Call office for qsymia prescription in 81month Schedule 106months f/up appt with me

## 2023-02-28 NOTE — Assessment & Plan Note (Signed)
Unable to use contrave due to current use of wellbutrin Does not like side effects of orlistat. Wants to switch to qsymia once unable to get wegovy in 55month. Wt Readings from Last 3 Encounters:  02/28/23 167 lb 3.2 oz (75.8 kg)  01/05/23 169 lb 6.4 oz (76.8 kg)  08/03/22 194 lb 9.6 oz (88.3 kg)

## 2023-03-02 ENCOUNTER — Other Ambulatory Visit: Payer: Self-pay

## 2023-03-11 ENCOUNTER — Other Ambulatory Visit (HOSPITAL_COMMUNITY): Payer: Self-pay

## 2023-03-19 ENCOUNTER — Other Ambulatory Visit (HOSPITAL_COMMUNITY): Payer: Self-pay
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
## 2023-03-22 ENCOUNTER — Other Ambulatory Visit (HOSPITAL_COMMUNITY): Payer: Self-pay

## 2023-03-24 ENCOUNTER — Telehealth (INDEPENDENT_AMBULATORY_CARE_PROVIDER_SITE_OTHER): Payer: 59 | Admitting: Psychiatry

## 2023-03-24 ENCOUNTER — Encounter: Payer: Self-pay | Admitting: Psychiatry

## 2023-03-24 ENCOUNTER — Other Ambulatory Visit (HOSPITAL_COMMUNITY): Payer: Self-pay

## 2023-03-24 DIAGNOSIS — F411 Generalized anxiety disorder: Secondary | ICD-10-CM

## 2023-03-24 DIAGNOSIS — F3341 Major depressive disorder, recurrent, in partial remission: Secondary | ICD-10-CM

## 2023-03-24 MED ORDER — BUPROPION HCL ER (XL) 300 MG PO TB24
300.0000 mg | ORAL_TABLET | Freq: Every day | ORAL | 2 refills | Status: DC
  Filled 2023-03-24 – 2023-04-29 (×2): qty 30, 30d supply, fill #0
  Filled 2023-06-07: qty 30, 30d supply, fill #1
  Filled 2023-07-26: qty 30, 30d supply, fill #2

## 2023-03-24 MED ORDER — HYDROXYZINE HCL 10 MG PO TABS
10.0000 mg | ORAL_TABLET | Freq: Every day | ORAL | 1 refills | Status: AC | PRN
Start: 2023-03-24 — End: 2023-05-24
  Filled 2023-03-24: qty 30, 30d supply, fill #0
  Filled 2023-04-13 – 2023-04-15 (×2): qty 30, 30d supply, fill #1

## 2023-03-24 NOTE — Patient Instructions (Addendum)
Cotinue bupropion 300 mg daily  Continue hydroxyzine 10 mg daily as needed for anxiety  Referred to therapy  Next appointment: 6/11 at 11 30

## 2023-03-25 ENCOUNTER — Other Ambulatory Visit (HOSPITAL_COMMUNITY): Payer: Self-pay

## 2023-03-27 ENCOUNTER — Telehealth: Payer: 59 | Admitting: Family Medicine

## 2023-03-27 DIAGNOSIS — K0889 Other specified disorders of teeth and supporting structures: Secondary | ICD-10-CM

## 2023-03-27 NOTE — Progress Notes (Signed)
E-Visit for Dental Pain  We are sorry that you are not feeling well.  Here is how we plan to help!  Based on what you have shared with me in the questionnaire, it sounds like you have dental pain.  Looks like you are only taking Ibuprofen 600 mg twice a day.  I recommend increasing Ibuprofen 600 mg to three times a day or every 8 hours. If the Ibuprofen is not helpful, let us know and we can prescribe Naprosyn instead.     Ibuprofen 600mg  3 times a day for 7 days for discomfort  It is imperative that you see a dentist within 10 days of this eVisit to determine the cause of the dental pain and be sure it is adequately treated  A toothache or tooth pain is caused when the nerve in the root of a tooth or surrounding a tooth is irritated. Dental (tooth) infection, decay, injury, or loss of a tooth are the most common causes of dental pain. Pain may also occur after an extraction (tooth is pulled out). Pain sometimes originates from other areas and radiates to the jaw, thus appearing to be tooth pain.Bacteria growing inside your mouth can contribute to gum disease and dental decay, both of which can cause pain. A toothache occurs from inflammation of the central portion of the tooth called pulp. The pulp contains nerve endings that are very sensitive to pain. Inflammation to the pulp or pulpitis may be caused by dental cavities, trauma, and infection.    HOME CARE:   For toothaches: Over-the-counter pain medications such as acetaminophen or ibuprofen may be used. Take these as directed on the package while you arrange for a dental appointment. Avoid very cold or hot foods, because they may make the pain worse. You may get relief from biting on a cotton ball soaked in oil of cloves. You can get oil of cloves at most drug stores.  For jaw pain:  Aspirin may be helpful for problems in the joint of the jaw in adults. If pain happens every time you open your mouth widely, the temporomandibular joint  (TMJ) may be the source of the pain. Yawning or taking a large bite of food may worsen the pain. An appointment with your doctor or dentist will help you find the cause.     GET HELP RIGHT AWAY IF:  You have a high fever or chills If you have had a recent head or face injury and develop headache, light headedness, nausea, vomiting, or other symptoms that concern you after an injury to your face or mouth, you could have a more serious injury in addition to your dental injury. A facial rash associated with a toothache: This condition may improve with medication. Contact your doctor for them to decide what is appropriate. Any jaw pain occurring with chest pain: Although jaw pain is most commonly caused by dental disease, it is sometimes referred pain from other areas. People with heart disease, especially people who have had stents placed, people with diabetes, or those who have had heart surgery may have jaw pain as a symptom of heart attack or angina. If your jaw or tooth pain is associated with lightheadedness, sweating, or shortness of breath, you should see a doctor as soon as possible. Trouble swallowing or excessive pain or bleeding from gums: If you have a history of a weakened immune system, diabetes, or steroid use, you may be more susceptible to infections. Infections can often be more severe and extensive or  caused by unusual organisms. Dental and gum infections in people with these conditions may require more aggressive treatment. An abscess may need draining or IV antibiotics, for example.  MAKE SURE YOU   Understand these instructions. Will watch your condition. Will get help right away if you are not doing well or get worse.  Thank you for choosing an e-visit.  Your e-visit answers were reviewed by a board certified advanced clinical practitioner to complete your personal care plan. Depending upon the condition, your plan could have included both over the counter or prescription  medications.  Please review your pharmacy choice. Make sure the pharmacy is open so you can pick up prescription now. If there is a problem, you may contact your provider through [employer] and have the prescription routed to another pharmacy.  Your safety is important to [place]. If you have drug allergies check your prescription carefully.   For the next 24 hours you can use MyChart to ask questions about today's visit, request a non-urgent call back, or ask for a work or school excuse. You will get an email in the next two days asking about your experience. I hope that your e-visit has been valuable and will speed your recovery. I have spent 5 minutes in review of e-visit questionnaire, review and updating patient chart, medical decision making and response to patient.   Reed Pandy, PA-C

## 2023-03-28 ENCOUNTER — Other Ambulatory Visit (HOSPITAL_COMMUNITY): Payer: Self-pay

## 2023-03-30 ENCOUNTER — Other Ambulatory Visit (HOSPITAL_COMMUNITY): Payer: Self-pay

## 2023-03-30 MED ORDER — AMOXICILLIN 500 MG PO CAPS
500.0000 mg | ORAL_CAPSULE | Freq: Three times a day (TID) | ORAL | 0 refills | Status: DC
  Filled 2023-03-30: qty 21, 7d supply, fill #0

## 2023-03-30 MED ORDER — IBUPROFEN 600 MG PO TABS
600.0000 mg | ORAL_TABLET | ORAL | 0 refills | Status: DC
  Filled 2023-03-30 (×3): qty 10, 2d supply, fill #0

## 2023-04-14 ENCOUNTER — Other Ambulatory Visit (HOSPITAL_COMMUNITY): Payer: Self-pay

## 2023-04-15 ENCOUNTER — Other Ambulatory Visit (HOSPITAL_COMMUNITY): Payer: Self-pay

## 2023-04-29 ENCOUNTER — Ambulatory Visit (INDEPENDENT_AMBULATORY_CARE_PROVIDER_SITE_OTHER): Payer: 59 | Admitting: Nurse Practitioner

## 2023-04-29 ENCOUNTER — Other Ambulatory Visit (HOSPITAL_COMMUNITY): Payer: Self-pay

## 2023-04-29 ENCOUNTER — Encounter: Payer: Self-pay | Admitting: Nurse Practitioner

## 2023-04-29 VITALS — BP 120/80 | HR 74 | Temp 98.4°F | Resp 16 | Ht 60.0 in | Wt 162.8 lb

## 2023-04-29 DIAGNOSIS — Z6831 Body mass index (BMI) 31.0-31.9, adult: Secondary | ICD-10-CM | POA: Diagnosis not present

## 2023-04-29 DIAGNOSIS — F339 Major depressive disorder, recurrent, unspecified: Secondary | ICD-10-CM

## 2023-04-29 DIAGNOSIS — Z3042 Encounter for surveillance of injectable contraceptive: Secondary | ICD-10-CM | POA: Diagnosis not present

## 2023-04-29 DIAGNOSIS — E6609 Other obesity due to excess calories: Secondary | ICD-10-CM | POA: Diagnosis not present

## 2023-04-29 LAB — COMPREHENSIVE METABOLIC PANEL
ALT: 16 U/L (ref 0–35)
AST: 17 U/L (ref 0–37)
Albumin: 4.3 g/dL (ref 3.5–5.2)
Alkaline Phosphatase: 48 U/L (ref 39–117)
BUN: 12 mg/dL (ref 6–23)
CO2: 24 mEq/L (ref 19–32)
Calcium: 9.2 mg/dL (ref 8.4–10.5)
Chloride: 105 mEq/L (ref 96–112)
Creatinine, Ser: 0.99 mg/dL (ref 0.40–1.20)
GFR: 74.52 mL/min (ref >60.00)
Glucose, Bld: 81 mg/dL (ref 70–99)
Potassium: 4.7 mEq/L (ref 3.5–5.1)
Sodium: 136 mEq/L (ref 135–145)
Total Bilirubin: 0.7 mg/dL (ref 0.2–1.2)
Total Protein: 7 g/dL (ref 6.0–8.3)

## 2023-04-29 MED ORDER — PHENTERMINE HCL 15 MG PO CAPS
15.0000 mg | ORAL_CAPSULE | ORAL | 0 refills | Status: DC
Start: 2023-05-16 — End: 2023-06-27
  Filled 2023-04-29: qty 30, 30d supply, fill #0

## 2023-04-29 NOTE — Patient Instructions (Signed)
Start phentermine after completion of wegovy Incorporate weight training exercise Go to lab

## 2023-04-29 NOTE — Assessment & Plan Note (Signed)
Next injection due 05/30/2023

## 2023-04-29 NOTE — Progress Notes (Signed)
Stable Follow instructions as discussed during office visit.

## 2023-04-29 NOTE — Progress Notes (Signed)
Established Patient Visit  Patient: Maria Wiggins   DOB: 09/19/1991   32 y.o. Female  MRN: [id number] Visit Date: 04/29/2023  Subjective:    Chief Complaint  Patient presents with   Weight Check   HPI Encounter for Depo-Provera contraception Next injection due 05/30/2023  Obesity Lost 7lbs in last 3months Total weight loss 84lbs in last 72yrs No longer able to afford in 3weeks due to change in insurance coverage. Exercise: walking daily Diet: low fat, low carb, high protein. BP Readings from Last 3 Encounters:  04/29/23 120/80  02/28/23 110/86  01/05/23 112/76    Wt Readings from Last 3 Encounters:  04/29/23 162 lb 12.8 oz (73.8 kg)  02/28/23 167 lb 3.2 oz (75.8 kg)  01/05/23 169 lb 6.4 oz (76.8 kg)    We discussed possible side effects of phentermine and possible DRUG-DRUG INTERACTION with wellbutrin. She verbalized understanding. Sent phentermine 15mg  to start in 3weeks Advised to incorporate weight training exercises. Repeat CMP F/up in 2months    Reviewed medical, surgical, and social history today  Medications: Outpatient Medications Prior to Visit  Medication Sig   medroxyPROGESTERone (DEPO-PROVERA) 150 MG/ML injection Inject 1 mL (150 mg total) into the muscle every 3 (three) months.   amoxicillin (AMOXIL) 500 MG capsule Take 1 capsule (500 mg total) by mouth 3 (three) times daily until gone   buPROPion (WELLBUTRIN XL) 300 MG 24 hr tablet Take 1 tablet (300 mg total) by mouth daily.   hydrOXYzine (ATARAX) 10 MG tablet Take 1 tablet (10 mg total) by mouth daily as needed for anxiety.   ibuprofen (ADVIL) 600 MG tablet Take 1 tablet (600 mg total) by mouth every 4 to 6 hours   Omega-3 Fatty Acids (FISH OIL PO) Take by mouth.   Propylene Glycol (SYSTANE BALANCE) 0.6 % SOLN Apply 1 drop to eye as needed.   [DISCONTINUED] Semaglutide-Weight Management (WEGOVY) 1.7 MG/0.75ML SOAJ Inject 1.7 mg into the skin once a week.   No facility-administered  medications prior to visit.   Reviewed past medical and social history.   ROS per HPI above      Objective:  BP 120/80 (BP Location: Left Arm, Patient Position: Sitting, Cuff Size: Large)   Pulse 74   Temp 98.4 F (36.9 C) (Temporal)   Resp 16   Ht 5' (1.524 m)   Wt 162 lb 12.8 oz (73.8 kg)   SpO2 100%   BMI 31.79 kg/m      Physical Exam Cardiovascular:     Rate and Rhythm: Normal rate and regular rhythm.     Pulses: Normal pulses.     Heart sounds: Normal heart sounds.  Pulmonary:     Effort: Pulmonary effort is normal.     Breath sounds: Normal breath sounds.  Neurological:     Mental Status: She is alert and oriented to person, place, and time.  Psychiatric:        Mood and Affect: Mood normal.        Behavior: Behavior normal.        Thought Content: Thought content normal.     No results found for any visits on 04/29/23.    Assessment & Plan:    Problem List Items Addressed This Visit       Other   Depression, recurrent (HCC)   Encounter for Depo-Provera contraception    Next injection due 05/30/2023      Relevant Medications  medroxyPROGESTERone (DEPO-PROVERA) 150 MG/ML injection   Obesity - Primary    Lost 7lbs in last 3months Total weight loss 84lbs in last 29yrs No longer able to afford in 3weeks due to change in insurance coverage. Exercise: walking daily Diet: low fat, low carb, high protein. BP Readings from Last 3 Encounters:  04/29/23 120/80  02/28/23 110/86  01/05/23 112/76    Wt Readings from Last 3 Encounters:  04/29/23 162 lb 12.8 oz (73.8 kg)  02/28/23 167 lb 3.2 oz (75.8 kg)  01/05/23 169 lb 6.4 oz (76.8 kg)    We discussed possible side effects of phentermine and possible DRUG-DRUG INTERACTION with wellbutrin. She verbalized understanding. Sent phentermine 15mg  to start in 3weeks Advised to incorporate weight training exercises. Repeat CMP F/up in 2months      Relevant Medications   phentermine 15 MG capsule (Start on  05/16/2023)   Other Relevant Orders   Comprehensive metabolic panel   Return in about 2 months (around 06/29/2023) for Weight management.     Alysia Penna, NP

## 2023-04-29 NOTE — Assessment & Plan Note (Addendum)
Lost 7lbs in last 3months Total weight loss 84lbs in last 58yrs No longer able to afford in 3weeks due to change in insurance coverage. Exercise: walking daily Diet: low fat, low carb, high protein. BP Readings from Last 3 Encounters:  04/29/23 120/80  02/28/23 110/86  01/05/23 112/76    Wt Readings from Last 3 Encounters:  04/29/23 162 lb 12.8 oz (73.8 kg)  02/28/23 167 lb 3.2 oz (75.8 kg)  01/05/23 169 lb 6.4 oz (76.8 kg)    We discussed possible side effects of phentermine and possible DRUG-DRUG INTERACTION with wellbutrin. She verbalized understanding. Sent phentermine 15mg  to start in 3weeks Advised to incorporate weight training exercises. Repeat CMP F/up in 2months
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
## 2023-05-10 ENCOUNTER — Encounter: Payer: 59 | Admitting: Psychiatry

## 2023-05-10 ENCOUNTER — Telehealth: Payer: Self-pay | Admitting: Psychiatry

## 2023-05-10 NOTE — Telephone Encounter (Signed)
Sent a video visit link through Epic, but the patient didn't sign in. Tried calling for today's appointment, but got no answer. Voice message was full.

## 2023-05-10 NOTE — Progress Notes (Signed)
This encounter was created in error - please disregard.

## 2023-05-31 ENCOUNTER — Ambulatory Visit (INDEPENDENT_AMBULATORY_CARE_PROVIDER_SITE_OTHER): Payer: 59

## 2023-05-31 DIAGNOSIS — Z3042 Encounter for surveillance of injectable contraceptive: Secondary | ICD-10-CM

## 2023-05-31 LAB — POCT URINE PREGNANCY: Preg Test, Ur: NEGATIVE

## 2023-05-31 MED ORDER — MEDROXYPROGESTERONE ACETATE 150 MG/ML IM SUSP
150.0000 mg | INTRAMUSCULAR | Status: AC
Start: 2023-05-31 — End: ?
  Administered 2023-05-31 – 2023-09-01 (×2): 150 mg via INTRAMUSCULAR

## 2023-05-31 NOTE — Progress Notes (Signed)
After obtaining consent, and per orders of [facility], injection of Depo-provera  given by Olga Coaster. Patient instructed to remain in clinic for 20 minutes afterwards, and to report any adverse reaction to me immediately.

## 2023-06-07 ENCOUNTER — Other Ambulatory Visit (HOSPITAL_COMMUNITY): Payer: Self-pay

## 2023-06-27 ENCOUNTER — Other Ambulatory Visit: Payer: Self-pay | Admitting: Nurse Practitioner

## 2023-06-27 ENCOUNTER — Other Ambulatory Visit (HOSPITAL_COMMUNITY): Payer: Self-pay

## 2023-06-27 DIAGNOSIS — E6609 Other obesity due to excess calories: Secondary | ICD-10-CM

## 2023-06-27 MED ORDER — PHENTERMINE HCL 15 MG PO CAPS
15.0000 mg | ORAL_CAPSULE | ORAL | 0 refills | Status: DC
Start: 2023-06-27 — End: 2024-03-15
  Filled 2023-06-27: qty 30, 30d supply, fill #0

## 2023-06-30 ENCOUNTER — Other Ambulatory Visit (HOSPITAL_COMMUNITY): Payer: Self-pay

## 2023-07-01 ENCOUNTER — Telehealth: Payer: 59 | Admitting: Physician Assistant

## 2023-07-01 ENCOUNTER — Other Ambulatory Visit (HOSPITAL_COMMUNITY): Payer: Self-pay

## 2023-07-01 DIAGNOSIS — M549 Dorsalgia, unspecified: Secondary | ICD-10-CM | POA: Diagnosis not present

## 2023-07-01 MED ORDER — NAPROXEN 500 MG PO TABS
500.0000 mg | ORAL_TABLET | Freq: Two times a day (BID) | ORAL | 0 refills | Status: DC
  Filled 2023-07-01: qty 30, 15d supply, fill #0

## 2023-07-01 MED ORDER — CYCLOBENZAPRINE HCL 10 MG PO TABS
5.0000 mg | ORAL_TABLET | Freq: Three times a day (TID) | ORAL | 0 refills | Status: DC | PRN
Start: 2023-07-01 — End: 2024-03-15
  Filled 2023-07-01: qty 30, 10d supply, fill #0

## 2023-07-01 NOTE — Progress Notes (Signed)

## 2023-07-05 ENCOUNTER — Ambulatory Visit: Payer: 59 | Admitting: Nurse Practitioner

## 2023-07-21 ENCOUNTER — Ambulatory Visit: Payer: 59 | Admitting: Nurse Practitioner

## 2023-07-22 ENCOUNTER — Ambulatory Visit: Payer: 59 | Admitting: Nurse Practitioner

## 2023-07-26 ENCOUNTER — Ambulatory Visit (INDEPENDENT_AMBULATORY_CARE_PROVIDER_SITE_OTHER): Payer: 59 | Admitting: Nurse Practitioner

## 2023-07-26 ENCOUNTER — Encounter: Payer: Self-pay | Admitting: Nurse Practitioner

## 2023-07-26 VITALS — BP 110/80 | HR 90 | Temp 97.2°F | Resp 16 | Ht 69.0 in | Wt 161.8 lb

## 2023-07-26 DIAGNOSIS — Z23 Encounter for immunization: Secondary | ICD-10-CM

## 2023-07-26 DIAGNOSIS — E663 Overweight: Secondary | ICD-10-CM | POA: Diagnosis not present

## 2023-07-26 NOTE — Assessment & Plan Note (Addendum)
Lost 1lb in last 2months with use of phentermine. Denies any adverse side effects. Total weight loss: 85lbs in last 71yrs BMI at 23.89 Wt Readings from Last 3 Encounters:  07/26/23 161 lb 12.8 oz (73.4 kg)  04/29/23 162 lb 12.8 oz (73.8 kg)  02/28/23 167 lb 3.2 oz (75.8 kg)    She is no longer considered obese at this time.  Stop phentermine. Advised to maintain low fat/low carb/high fiber/hgh protein diet, daily exercise ( of moderate intensity exercise weekly). F/up in 3months

## 2023-07-26 NOTE — Progress Notes (Signed)
Established Patient Visit  Patient: Maria Wiggins   DOB: [birth year]   32 y.o. Female  MRN: [id number] Visit Date: 07/26/2023  Subjective:    Chief Complaint  Patient presents with   Weight Check    Flu shot    HPI Overweight Lost 1lb in last 2months with use of phentermine. Denies any adverse side effects. Total weight loss: 85lbs in last 30yrs BMI at 23.89 Wt Readings from Last 3 Encounters:  07/26/23 161 lb 12.8 oz (73.4 kg)  04/29/23 162 lb 12.8 oz (73.8 kg)  02/28/23 167 lb 3.2 oz (75.8 kg)    She is no longer considered obese at this time.  Stop phentermine. Advised to maintain low fat/low carb/high fiber/hgh protein diet, daily exercise ( of moderate intensity exercise weekly). F/up in 3months   Reviewed medical, surgical, and social history today  Medications: Outpatient Medications Prior to Visit  Medication Sig   buPROPion (WELLBUTRIN XL) 300 MG 24 hr tablet Take 1 tablet (300 mg total) by mouth daily.   ibuprofen (ADVIL) 600 MG tablet Take 1 tablet (600 mg total) by mouth every 4 to 6 hours   medroxyPROGESTERone (DEPO-PROVERA) 150 MG/ML injection Inject 1 mL (150 mg total) into the muscle every 3 (three) months.   naproxen (NAPROSYN) 500 MG tablet Take 1 tablet (500 mg total) by mouth 2 (two) times daily with a meal.   Omega-3 Fatty Acids (FISH OIL PO) Take by mouth.   phentermine 15 MG capsule Take 1 capsule (15 mg total) by mouth every morning. Maintain upcoming appointment for additional refills   Propylene Glycol (SYSTANE BALANCE) 0.6 % SOLN Apply 1 drop to eye as needed.   amoxicillin (AMOXIL) 500 MG capsule Take 1 capsule (500 mg total) by mouth 3 (three) times daily until gone (Patient not taking: Reported on 07/26/2023)   cyclobenzaprine (FLEXERIL) 10 MG tablet Take 1/2-1 tablet (5-10 mg total) by mouth 3 (three) times daily as needed. (Patient not taking: Reported on 07/26/2023)   Facility-Administered Medications Prior to Visit   Medication Dose Route Frequency Provider   medroxyPROGESTERone (DEPO-PROVERA) injection 150 mg  150 mg Intramuscular Q90 days Winnona Wargo, Bonna Gains, NP   Reviewed past medical and social history.   ROS per HPI above      Objective:  BP 110/80 (BP Location: Left Arm, Patient Position: Sitting, Cuff Size: Normal)   Pulse 90   Temp (!) 97.2 F (36.2 C) (Temporal)   Resp 16   Ht 5\' 9"  (1.753 m)   Wt 161 lb 12.8 oz (73.4 kg)   SpO2 98%   BMI 23.89 kg/m      Physical Exam Vitals and nursing note reviewed.  Neck:     Thyroid: No thyroid mass, thyromegaly or thyroid tenderness.  Cardiovascular:     Rate and Rhythm: Normal rate and regular rhythm.     Pulses: Normal pulses.     Heart sounds: Normal heart sounds.  Pulmonary:     Effort: Pulmonary effort is normal.     Breath sounds: Normal breath sounds.  Neurological:     Mental Status: She is alert and oriented to person, place, and time.  Psychiatric:        Mood and Affect: Mood normal.        Behavior: Behavior normal.        Thought Content: Thought content normal.     No results found for any visits on  07/26/23.    Assessment & Plan:    Problem List Items Addressed This Visit     Overweight    Lost 1lb in last 2months with use of phentermine. Denies any adverse side effects. Total weight loss: 85lbs in last 78yrs BMI at 23.89 Wt Readings from Last 3 Encounters:  07/26/23 161 lb 12.8 oz (73.4 kg)  04/29/23 162 lb 12.8 oz (73.8 kg)  02/28/23 167 lb 3.2 oz (75.8 kg)    She is no longer considered obese at this time.  Stop phentermine. Advised to maintain low fat/low carb/high fiber/hgh protein diet, daily exercise ( of moderate intensity exercise weekly). F/up in 3months      Other Visit Diagnoses     Encounter for immunization    -  Primary   Relevant Orders   Flu vaccine trivalent PF, 6mos and older(Flulaval,Afluria,Fluarix,Fluzone) (Completed)      Return in about 3 months (around 10/26/2023)  for Weight management, depression and anxiety.     Alysia Penna, NP

## 2023-07-26 NOTE — Patient Instructions (Signed)
Stop phentermine Maintain heart healthy diet and daily exercise.  Calorie Counting for Weight Loss Calories are units of energy. Your body needs a certain number of calories from food to keep going throughout the day. When you eat or drink more calories than your body needs, your body stores the extra calories mostly as fat. When you eat or drink fewer calories than your body needs, your body burns fat to get the energy it needs. Calorie counting means keeping track of how many calories you eat and drink each day. Calorie counting can be helpful if you need to lose weight. If you eat fewer calories than your body needs, you should lose weight. Ask your health care provider what a healthy weight is for you. For calorie counting to work, you will need to eat the right number of calories each day to lose a healthy amount of weight per week. A dietitian can help you figure out how many calories you need in a day and will suggest ways to reach your calorie goal. A healthy amount of weight to lose each week is usually 1-2 lb (0.5-0.9 kg). This usually means that your daily calorie intake should be reduced by 500-750 calories. Eating 1,200-1,500 calories a day can help most women lose weight. Eating 1,500-1,800 calories a day can help most men lose weight. What do I need to know about calorie counting? Work with your health care provider or dietitian to determine how many calories you should get each day. To meet your daily calorie goal, you will need to: Find out how many calories are in each food that you would like to eat. Try to do this before you eat. Decide how much of the food you plan to eat. Keep a food log. Do this by writing down what you ate and how many calories it had. To successfully lose weight, it is important to balance calorie counting with a healthy lifestyle that includes regular activity. Where do I find calorie information?  The number of calories in a food can be found on a  Nutrition Facts label. If a food does not have a Nutrition Facts label, try to look up the calories online or ask your dietitian for help. Remember that calories are listed per serving. If you choose to have more than one serving of a food, you will have to multiply the calories per serving by the number of servings you plan to eat. For example, the label on a package of bread might say that a serving size is 1 slice and that there are 90 calories in a serving. If you eat 1 slice, you will have eaten 90 calories. If you eat 2 slices, you will have eaten 180 calories. How do I keep a food log? After each time that you eat, record the following in your food log as soon as possible: What you ate. Be sure to include toppings, sauces, and other extras on the food. How much you ate. This can be measured in cups, ounces, or number of items. How many calories were in each food and drink. The total number of calories in the food you ate. Keep your food log near you, such as in a pocket-sized notebook or on an app or website on your mobile phone. Some programs will calculate calories for you and show you how many calories you have left to meet your daily goal. What are some portion-control tips? Know how many calories are in a serving. This will help you  know how many servings you can have of a certain food. Use a measuring cup to measure serving sizes. You could also try weighing out portions on a kitchen scale. With time, you will be able to estimate serving sizes for some foods. Take time to put servings of different foods on your favorite plates or in your favorite bowls and cups so you know what a serving looks like. Try not to eat straight from a food's packaging, such as from a bag or box. Eating straight from the package makes it hard to see how much you are eating and can lead to overeating. Put the amount you would like to eat in a cup or on a plate to make sure you are eating the right portion. Use  smaller plates, glasses, and bowls for smaller portions and to prevent overeating. Try not to multitask. For example, avoid watching TV or using your computer while eating. If it is time to eat, sit down at a table and enjoy your food. This will help you recognize when you are full. It will also help you be more mindful of what and how much you are eating. What are tips for following this plan? Reading food labels Check the calorie count compared with the serving size. The serving size may be smaller than what you are used to eating. Check the source of the calories. Try to choose foods that are high in protein, fiber, and vitamins, and low in saturated fat, trans fat, and sodium. Shopping Read nutrition labels while you shop. This will help you make healthy decisions about which foods to buy. Pay attention to nutrition labels for low-fat or fat-free foods. These foods sometimes have the same number of calories or more calories than the full-fat versions. They also often have added sugar, starch, or salt to make up for flavor that was removed with the fat. Make a grocery list of lower-calorie foods and stick to it. Cooking Try to cook your favorite foods in a healthier way. For example, try baking instead of frying. Use low-fat dairy products. Meal planning Use more fruits and vegetables. One-half of your plate should be fruits and vegetables. Include lean proteins, such as chicken, [place], and fish. Lifestyle Each week, aim to do one of the following: 150 minutes of moderate exercise, such as walking. 75 minutes of vigorous exercise, such as running. General information Know how many calories are in the foods you eat most often. This will help you calculate calorie counts faster. Find a way of tracking calories that works for you. Get creative. Try different apps or programs if writing down calories does not work for you. What foods should I eat?  Eat nutritious foods. It is better to have  a nutritious, high-calorie food, such as an avocado, than a food with few nutrients, such as a bag of potato chips. Use your calories on foods and drinks that will fill you up and will not leave you hungry soon after eating. Examples of foods that fill you up are nuts and nut butters, vegetables, lean proteins, and high-fiber foods such as whole grains. High-fiber foods are foods with more than 5 g of fiber per serving. Pay attention to calories in drinks. Low-calorie drinks include water and unsweetened drinks. The items listed above may not be a complete list of foods and beverages you can eat. Contact a dietitian for more information. What foods should I limit? Limit foods or drinks that are not good sources of vitamins, minerals,  or protein or that are high in unhealthy fats. These include: Candy. Other sweets. Sodas, specialty coffee drinks, alcohol, and juice. The items listed above may not be a complete list of foods and beverages you should avoid. Contact a dietitian for more information. How do I count calories when eating out? Pay attention to portions. Often, portions are much larger when eating out. Try these tips to keep portions smaller: Consider sharing a meal instead of getting your own. If you get your own meal, eat only half of it. Before you start eating, ask for a container and put half of your meal into it. When available, consider ordering smaller portions from the menu instead of full portions. Pay attention to your food and drink choices. Knowing the way food is cooked and what is included with the meal can help you eat fewer calories. If calories are listed on the menu, choose the lower-calorie options. Choose dishes that include vegetables, fruits, whole grains, low-fat dairy products, and lean proteins. Choose items that are boiled, broiled, grilled, or steamed. Avoid items that are buttered, battered, fried, or served with cream sauce. Items labeled as crispy are  usually fried, unless stated otherwise. Choose water, low-fat milk, unsweetened iced tea, or other drinks without added sugar. If you want an alcoholic beverage, choose a lower-calorie option, such as a glass of wine or light beer. Ask for dressings, sauces, and syrups on the side. These are usually high in calories, so you should limit the amount you eat. If you want a salad, choose a garden salad and ask for grilled meats. Avoid extra toppings such as bacon, cheese, or fried items. Ask for the dressing on the side, or ask for olive oil and vinegar or lemon to use as dressing. Estimate how many servings of a food you are given. Knowing serving sizes will help you be aware of how much food you are eating at restaurants. Where to find more information Centers for Disease Control and Prevention: FootballExhibition.com.br U.[place]. Department of Agriculture: WrestlingReporter.dk Summary Calorie counting means keeping track of how many calories you eat and drink each day. If you eat fewer calories than your body needs, you should lose weight. A healthy amount of weight to lose per week is usually 1-2 lb (0.5-0.9 kg). This usually means reducing your daily calorie intake by 500-750 calories. The number of calories in a food can be found on a Nutrition Facts label. If a food does not have a Nutrition Facts label, try to look up the calories online or ask your dietitian for help. Use smaller plates, glasses, and bowls for smaller portions and to prevent overeating. Use your calories on foods and drinks that will fill you up and not leave you hungry shortly after a meal. This information is not intended to replace advice given to you by your health care provider. Make sure you discuss any questions you have with your health care provider. Document Revised: 12/27/2019 Document Reviewed: 12/27/2019 Elsevier Patient Education  2023 [employer]   How to Increase Your Level of Physical Activity Getting regular physical activity is  important for your overall health and well-being. Most people do not get enough exercise. There are easy ways to increase your level of physical activity, even if you have not been very active in the past or if you are just starting out. What are the benefits of physical activity? Physical activity has many short-term and long-term benefits. Being active on a regular basis can improve your physical  and mental health as well as provide other benefits. Physical health benefits Helping you lose weight or maintain a healthy weight. Strengthening your muscles and bones. Reducing your risk of certain long-term (chronic) diseases, including heart disease, cancer, and diabetes. Being able to move around more easily and for longer periods of time without getting tired (increased endurance or stamina). Improving your ability to fight off illness (enhanced immunity). Being able to sleep better. Helping you stay healthy as you get older, including: Helping you stay mobile, or capable of walking and moving around. Preventing accidents, such as falls. Increasing life expectancy. Mental health benefits Boosting your mood and improving your self-esteem. Lowering your chance of having mental health problems, such as depression or anxiety. Helping you feel good about your body. Other benefits Finding new sources of fun and enjoyment. Meeting new people who share a common interest. Before you begin If you have a chronic illness or have not been active for a while, check with your health care provider about how to get started. Ask your health care provider what activities are safe for you. Start out slowly. Walking or doing some simple chair exercises is a good place to start, especially if you have not been active before or for a long time. Set goals that you can work toward. Ask your health care provider how much exercise is best for you. In general, most adults should: Do moderate-intensity exercise for at  least 150 minutes each week (30 minutes on most days of the week) or vigorous exercise for at least 75 minutes each week, or a combination of these. Moderate-intensity exercise can include walking at a quick pace, biking, yoga, water aerobics, or gardening. Vigorous exercise involves activities that take more effort, such as jogging or running, playing sports, swimming laps, or jumping rope. Do strength exercises on at least 2 days each week. This can include weight lifting, body weight exercises, and resistance-band exercises. How to be more physically active Make a plan  Try to find activities that you enjoy. You are more likely to commit to an exercise routine if it does not feel like a chore. If you have bone or joint problems, choose low-impact exercises, like walking or swimming. Use these tips for being successful with an exercise plan: Find a workout partner for accountability. Join a group or class, such as an aerobics class, cycling class, or sports team. Make family time active. Go for a walk, bike, or swim. Include a variety of exercises each week. Consider using a fitness tracker, such as a mobile phone app or a device worn like a watch, that will count the number of steps you take each day. Many people strive to reach 10,000 steps a day. Find ways to be active in your daily routines Besides your formal exercise plans, you can find ways to do physical activity during your daily routines, such as: Walking or biking to work or to the store. Taking the stairs instead of the elevator. Parking farther away from the door at work or at the store. Planning walking meetings. Walking around while you are on the phone. Where to find more information Centers for Disease Control and Prevention: CampusCasting.com.pt [employer] on Fitness, Sports & Nutrition: www.fitness.gov ChooseMyPlate: http://www.harvey.com/ Contact a health care provider if: You have headaches, muscle aches,  or joint pain that is concerning. You feel dizzy or light-headed while exercising. You faint. You feel your heart skipping, racing, or fluttering. You have chest pain while exercising. Summary Exercise benefits  your mind and body at any age, even if you are just starting out. If you have a chronic illness or have not been active for a while, check with your health care provider before increasing your physical activity. Choose activities that are safe and enjoyable for you. Ask your health care provider what activities are safe for you. Start slowly. Tell your health care provider if you have problems as you start to increase your activity level. This information is not intended to replace advice given to you by your health care provider. Make sure you discuss any questions you have with your health care provider. Document Revised: 03/13/2021 Document Reviewed: 03/13/2021 Elsevier Patient Education  2024 [employer].

## 2023-08-31 ENCOUNTER — Other Ambulatory Visit (HOSPITAL_COMMUNITY): Payer: Self-pay

## 2023-08-31 ENCOUNTER — Other Ambulatory Visit: Payer: Self-pay | Admitting: Psychiatry

## 2023-08-31 MED ORDER — BUPROPION HCL ER (XL) 300 MG PO TB24
300.0000 mg | ORAL_TABLET | Freq: Every day | ORAL | 0 refills | Status: DC
  Filled 2023-08-31: qty 30, 30d supply, fill #0

## 2023-08-31 NOTE — Telephone Encounter (Signed)
Scheduled for 09/22/23

## 2023-08-31 NOTE — Telephone Encounter (Signed)
The refill has been ordered as requested. Please contact the patient to schedule a follow-up visit.

## 2023-09-01 ENCOUNTER — Ambulatory Visit (INDEPENDENT_AMBULATORY_CARE_PROVIDER_SITE_OTHER): Payer: 59

## 2023-09-01 DIAGNOSIS — Z3042 Encounter for surveillance of injectable contraceptive: Secondary | ICD-10-CM

## 2023-09-01 LAB — POCT URINE PREGNANCY: Preg Test, Ur: NEGATIVE

## 2023-09-01 MED ORDER — MEDROXYPROGESTERONE ACETATE 150 MG/ML IM SUSP
150.0000 mg | INTRAMUSCULAR | Status: AC
Start: 2023-09-01 — End: ?

## 2023-09-01 NOTE — Progress Notes (Signed)
After obtaining consent, and per orders of [facility], injection of medroxyprogesterone acetate (depo-provera) 150mg  given IM right deltoid by Pamala Hurry Burns-White. POC urine pregnancy test was negative. Patient instructed to remain in clinic for 20 minutes after injection, and to report any adverse reaction to me immediately. Pt tolerated injection well.
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
## 2023-09-22 ENCOUNTER — Other Ambulatory Visit (HOSPITAL_COMMUNITY): Payer: Self-pay

## 2023-09-22 ENCOUNTER — Telehealth: Payer: 59 | Admitting: Psychiatry

## 2023-09-22 ENCOUNTER — Encounter: Payer: Self-pay | Admitting: Psychiatry

## 2023-09-22 DIAGNOSIS — F411 Generalized anxiety disorder: Secondary | ICD-10-CM

## 2023-09-22 DIAGNOSIS — F3341 Major depressive disorder, recurrent, in partial remission: Secondary | ICD-10-CM

## 2023-09-22 MED ORDER — BUPROPION HCL ER (XL) 150 MG PO TB24
150.0000 mg | ORAL_TABLET | Freq: Every day | ORAL | 1 refills | Status: DC
  Filled 2023-09-22: qty 30, 30d supply, fill #0
  Filled 2023-10-26: qty 30, 30d supply, fill #1

## 2023-09-22 MED ORDER — BUPROPION HCL ER (XL) 300 MG PO TB24
300.0000 mg | ORAL_TABLET | Freq: Every day | ORAL | 1 refills | Status: DC
  Filled 2023-09-22 – 2023-10-07 (×3): qty 30, 30d supply, fill #0
  Filled 2023-11-15: qty 30, 30d supply, fill #1

## 2023-09-22 NOTE — Patient Instructions (Signed)
Continue bupropion 300 mg daily  Continue hydroxyzine 10 mg daily as needed for anxiety  Next appointment: 12/20 at 10 30

## 2023-09-26 ENCOUNTER — Other Ambulatory Visit: Payer: Self-pay

## 2023-10-07 ENCOUNTER — Other Ambulatory Visit (HOSPITAL_COMMUNITY): Payer: Self-pay

## 2023-10-16 ENCOUNTER — Other Ambulatory Visit: Payer: Self-pay | Admitting: Nurse Practitioner

## 2023-10-16 DIAGNOSIS — E6609 Other obesity due to excess calories: Secondary | ICD-10-CM

## 2023-10-17 ENCOUNTER — Other Ambulatory Visit (HOSPITAL_COMMUNITY): Payer: Self-pay

## 2023-10-26 ENCOUNTER — Other Ambulatory Visit (HOSPITAL_COMMUNITY): Payer: Self-pay
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
## 2023-11-15 ENCOUNTER — Other Ambulatory Visit (HOSPITAL_COMMUNITY): Payer: Self-pay

## 2023-11-18 ENCOUNTER — Encounter: Payer: Self-pay | Admitting: Psychiatry

## 2023-11-18 ENCOUNTER — Other Ambulatory Visit (HOSPITAL_COMMUNITY): Payer: Self-pay

## 2023-11-18 ENCOUNTER — Telehealth: Payer: 59 | Admitting: Psychiatry

## 2023-11-18 DIAGNOSIS — F3341 Major depressive disorder, recurrent, in partial remission: Secondary | ICD-10-CM

## 2023-11-18 DIAGNOSIS — F411 Generalized anxiety disorder: Secondary | ICD-10-CM

## 2023-11-18 DIAGNOSIS — R5383 Other fatigue: Secondary | ICD-10-CM

## 2023-11-18 DIAGNOSIS — R632 Polyphagia: Secondary | ICD-10-CM

## 2023-11-18 MED ORDER — TOPIRAMATE 25 MG PO CPSP
25.0000 mg | ORAL_CAPSULE | Freq: Every day | ORAL | 2 refills | Status: DC
  Filled 2023-11-18: qty 30, 30d supply, fill #0

## 2023-11-18 MED ORDER — BUPROPION HCL ER (XL) 300 MG PO TB24
300.0000 mg | ORAL_TABLET | Freq: Every day | ORAL | 1 refills | Status: DC
  Filled 2023-12-09 – 2023-12-28 (×2): qty 30, 30d supply, fill #0
  Filled 2024-02-07: qty 30, 30d supply, fill #1

## 2023-11-18 MED ORDER — BUPROPION HCL ER (XL) 150 MG PO TB24
150.0000 mg | ORAL_TABLET | Freq: Every day | ORAL | 1 refills | Status: DC
  Filled 2023-11-18: qty 30, 30d supply, fill #0
  Filled 2023-12-01: qty 9, 9d supply, fill #0
  Filled 2023-12-28: qty 30, 30d supply, fill #1
  Filled 2024-02-07 (×2): qty 21, 21d supply, fill #2

## 2023-11-18 NOTE — Patient Instructions (Signed)
Continue bupropion 450 mg daily  Start topiramate 25 mg at night  Continue hydroxyzine 10 mg daily as needed for anxiety  Referral for sleep evaluation Next appointment: 2/21 at 8 30

## 2023-11-21 ENCOUNTER — Ambulatory Visit (INDEPENDENT_AMBULATORY_CARE_PROVIDER_SITE_OTHER): Payer: 59

## 2023-11-21 DIAGNOSIS — Z3042 Encounter for surveillance of injectable contraceptive: Secondary | ICD-10-CM | POA: Diagnosis not present

## 2023-11-21 NOTE — Progress Notes (Signed)
Patient presents for Depo-provera injection. Injection placed in right deltoid region. Patient tolerated procedure well with no concerns.  Patient advised to return between March 10th-24th for next depo.

## 2023-12-01 ENCOUNTER — Other Ambulatory Visit (HOSPITAL_COMMUNITY): Payer: Self-pay

## 2023-12-09 ENCOUNTER — Other Ambulatory Visit: Payer: Self-pay

## 2023-12-09 ENCOUNTER — Other Ambulatory Visit (HOSPITAL_COMMUNITY): Payer: Self-pay

## 2023-12-21 ENCOUNTER — Other Ambulatory Visit (HOSPITAL_COMMUNITY): Payer: Self-pay

## 2023-12-28 ENCOUNTER — Other Ambulatory Visit (HOSPITAL_COMMUNITY): Payer: Self-pay

## 2024-01-14 NOTE — Progress Notes (Unsigned)
Sent a video visit link through Epic, but the patient didn't sign in. Tried calling for today's appointment, but got no answer.No option to leave a voice message.

## 2024-01-20 ENCOUNTER — Telehealth: Payer: Commercial Managed Care - PPO | Admitting: Psychiatry

## 2024-01-20 DIAGNOSIS — Z91199 Patient's noncompliance with other medical treatment and regimen due to unspecified reason: Secondary | ICD-10-CM

## 2024-02-07 ENCOUNTER — Other Ambulatory Visit: Payer: Self-pay | Admitting: Psychiatry

## 2024-02-07 ENCOUNTER — Ambulatory Visit (INDEPENDENT_AMBULATORY_CARE_PROVIDER_SITE_OTHER): Payer: 59

## 2024-02-07 ENCOUNTER — Other Ambulatory Visit (HOSPITAL_COMMUNITY): Payer: Self-pay

## 2024-02-07 DIAGNOSIS — Z3042 Encounter for surveillance of injectable contraceptive: Secondary | ICD-10-CM | POA: Diagnosis not present

## 2024-02-07 MED ORDER — MEDROXYPROGESTERONE ACETATE 150 MG/ML IM SUSP
150.0000 mg | INTRAMUSCULAR | Status: AC
  Administered 2024-02-07: 150 mg via INTRAMUSCULAR

## 2024-02-07 NOTE — Progress Notes (Signed)
 Patient presents for Depo-provera injection. Injection placed in left deltoid region by Malena Peer, CMA.  Patient tolerated procedure well with no concerns.  Patient advised to return between May 27th-June 10th for next depo. Next appointment scheduled for June 4th, 2025 at 3:40pm and patient aware.

## 2024-02-07 NOTE — Progress Notes (Signed)
 Needs to schedule annual CPE appointment with me.

## 2024-03-15 ENCOUNTER — Other Ambulatory Visit: Payer: Self-pay | Admitting: Nurse Practitioner

## 2024-03-15 ENCOUNTER — Other Ambulatory Visit: Payer: Self-pay | Admitting: Psychiatry

## 2024-03-15 ENCOUNTER — Other Ambulatory Visit (HOSPITAL_COMMUNITY): Payer: Self-pay

## 2024-03-15 DIAGNOSIS — F418 Other specified anxiety disorders: Secondary | ICD-10-CM

## 2024-03-15 MED ORDER — HYDROXYZINE HCL 10 MG PO TABS
10.0000 mg | ORAL_TABLET | Freq: Every day | ORAL | 0 refills | Status: AC | PRN
  Filled 2024-03-15: qty 30, 30d supply, fill #0

## 2024-03-15 MED ORDER — BUPROPION HCL ER (XL) 300 MG PO TB24
300.0000 mg | ORAL_TABLET | Freq: Every day | ORAL | 1 refills | Status: DC
  Filled 2024-03-15: qty 30, 30d supply, fill #0
  Filled 2024-04-20: qty 30, 30d supply, fill #1

## 2024-03-15 MED ORDER — BUPROPION HCL ER (XL) 150 MG PO TB24
150.0000 mg | ORAL_TABLET | Freq: Every day | ORAL | 1 refills | Status: DC
  Filled 2024-03-15: qty 30, 30d supply, fill #0
  Filled 2024-04-20: qty 30, 30d supply, fill #1

## 2024-03-15 NOTE — Telephone Encounter (Signed)
 Not seen since last Dec. Could you contact her to make a follow up? Virtual is fine. Will plan to send a refill until her next visit.

## 2024-03-15 NOTE — Telephone Encounter (Signed)
 Patient scheduled for virtual 04-26-24

## 2024-03-16 ENCOUNTER — Other Ambulatory Visit (HOSPITAL_COMMUNITY): Payer: Self-pay

## 2024-03-22 ENCOUNTER — Ambulatory Visit (INDEPENDENT_AMBULATORY_CARE_PROVIDER_SITE_OTHER): Admitting: Nurse Practitioner

## 2024-03-22 ENCOUNTER — Other Ambulatory Visit (HOSPITAL_COMMUNITY)
Admission: RE | Admit: 2024-03-22 | Discharge: 2024-03-22 | Disposition: A | Discharge status: Home / Self Care | Source: Ambulatory Visit | Attending: Nurse Practitioner | Admitting: Nurse Practitioner

## 2024-03-22 ENCOUNTER — Encounter: Admitting: Nurse Practitioner

## 2024-03-22 VITALS — BP 118/72 | HR 88 | Temp 97.9°F | Ht 70.0 in | Wt 189.0 lb

## 2024-03-22 DIAGNOSIS — Z136 Encounter for screening for cardiovascular disorders: Secondary | ICD-10-CM | POA: Diagnosis not present

## 2024-03-22 DIAGNOSIS — Z3042 Encounter for surveillance of injectable contraceptive: Secondary | ICD-10-CM

## 2024-03-22 DIAGNOSIS — Z1159 Encounter for screening for other viral diseases: Secondary | ICD-10-CM | POA: Insufficient documentation

## 2024-03-22 DIAGNOSIS — Z0000 Encounter for general adult medical examination without abnormal findings: Secondary | ICD-10-CM

## 2024-03-22 DIAGNOSIS — Z13220 Encounter for screening for lipoid disorders: Secondary | ICD-10-CM

## 2024-03-22 LAB — COMPREHENSIVE METABOLIC PANEL WITH GFR
ALT: 20 U/L (ref 0–35)
AST: 21 U/L (ref 0–37)
Albumin: 4.7 g/dL (ref 3.5–5.2)
Alkaline Phosphatase: 47 U/L (ref 39–117)
BUN: 13 mg/dL (ref 6–23)
CO2: 24 meq/L (ref 19–32)
Calcium: 9.4 mg/dL (ref 8.4–10.5)
Chloride: 103 meq/L (ref 96–112)
Creatinine, Ser: 1.06 mg/dL (ref 0.40–1.20)
GFR: 68.23 mL/min (ref >60.00)
Glucose, Bld: 82 mg/dL (ref 70–99)
Potassium: 3.5 meq/L (ref 3.5–5.1)
Sodium: 135 meq/L (ref 135–145)
Total Bilirubin: 0.7 mg/dL (ref 0.2–1.2)
Total Protein: 7.5 g/dL (ref 6.0–8.3)

## 2024-03-22 LAB — LIPID PANEL
Cholesterol: 147 mg/dL (ref 0–200)
HDL: 60.6 mg/dL (ref >39.00)
LDL Cholesterol: 77 mg/dL (ref 0–99)
NonHDL: 86.25
Total CHOL/HDL Ratio: 2
Triglycerides: 44 mg/dL (ref 0.0–149.0)
VLDL: 8.8 mg/dL (ref 0.0–40.0)

## 2024-03-22 LAB — TSH: TSH: 2.2 u[IU]/mL (ref 0.35–5.50)

## 2024-03-22 NOTE — Patient Instructions (Signed)
 Go to lab Maintain Heart healthy diet and daily exercise. Maintain current medications.

## 2024-03-22 NOTE — Progress Notes (Signed)
 Complete physical exam  Patient: Maria Wiggins   DOB: 1991-09-16   33 y.o. Female  MRN: [id number] Visit Date: 03/22/2024  Subjective:    Chief Complaint  Patient presents with   Annual Exam    Questions about BC injection    Maria Wiggins is a 33 y.o. female who presents today for a complete physical exam. She reports consuming a low fat and low sodium diet. Home exercise routine includes calisthenics and weight training. She generally feels well. She reports sleeping well. She does have additional problems to discuss today.  Vision:No Dental:Yes STD Screen:Yes  BP Readings from Last 3 Encounters:  03/22/24 118/72  07/26/23 110/80  04/29/23 120/80   Wt Readings from Last 3 Encounters:  03/22/24 189 lb (85.7 kg)  07/26/23 161 lb 12.8 oz (73.4 kg)  04/29/23 162 lb 12.8 oz (73.8 kg)   Most recent fall risk assessment:    03/22/2024    1:08 PM  Fall Risk   Falls in the past year? 0  Number falls in past yr: 0  Injury with Fall? 0  Risk for fall due to : No Fall Risks  Follow up Falls evaluation completed   Depression screen:Yes - No Depression Most recent depression screenings:    03/22/2024    1:08 PM 04/29/2023    9:33 AM  PHQ 2/9 Scores  PHQ - 2 Score 2 2  PHQ- 9 Score 7 3    HPI  Encounter for Depo-Provera  contraception Use of depo provera  since 02/2023. She is concerned about adverse effects with prolonged use: bone demineralization, DIABETES, cancer, heart disease, blindness.  Advised about need to maintain calcium, vit D, and weight bearing exercise to help maintain bone mass. She can switch contraception after 52yrs if she desires. We discussed her risk for breast cancer, DIABETES, and cardiovascular disease. She risk is not any higher compared to the general population. Advised about lack of correlation between blindness and use of depoprovera. She verbalized understanding and opted to continue injections   Past Medical History:  Diagnosis Date   Anxiety     History reviewed. No pertinent surgical history. Social History   Socioeconomic History   Marital status: Single    Spouse name: Not on file   Number of children: 1   Years of education: Not on file   Highest education level: Some college, no degree  Occupational History   Not on file  Tobacco Use   Smoking status: Never   Smokeless tobacco: Never  Vaping Use   Vaping status: Never Used  Substance and Sexual Activity   Alcohol use: Not Currently   Drug use: Never   Sexual activity: Not Currently  Other Topics Concern   Not on file  Social History Narrative   Not on file   Social Drivers of Health   Financial Resource Strain: Low Risk  (03/22/2024)   Overall Financial Resource Strain (CARDIA)    Difficulty of Paying Living Expenses: Not very hard  Food Insecurity: No Food Insecurity (03/22/2024)   Hunger Vital Sign    Worried About Running Out of Food in the Last Year: Never true    Ran Out of Food in the Last Year: Never true  Transportation Needs: No Transportation Needs (03/22/2024)   PRAPARE - Administrator, Civil Service (Medical): No    Lack of Transportation (Non-Medical): No  Physical Activity: Insufficiently Active (03/22/2024)   Exercise Vital Sign    Days of Exercise per Week: 3 days  Minutes of Exercise per Session: 30 min  Stress: No Stress Concern Present (03/22/2024)   [employer] of Occupational Health - Occupational Stress Questionnaire    Feeling of Stress : Only a little  Social Connections: Moderately Integrated (03/22/2024)   Social Connection and Isolation Panel [NHANES]    Frequency of Communication with Friends and Family: More than three times a week    Frequency of Social Gatherings with Friends and Family: More than three times a week    Attends Religious Services: More than 4 times per year    Active Member of [employer] or Organizations: Yes    Attends Banker Meetings: 1 to 4 times per year    Marital Status:  Never married  Catering manager Violence: Not on file   Family Status  Relation Name Status   Mother  Alive   Father  Alive  No partnership data on file   Family History  Problem Relation Age of Onset   Diabetes Father    No Known Allergies  Patient Care Team: Cinque Begley, Connye Delaine, NP as PCP - General (Internal Medicine)   Medications: Outpatient Medications Prior to Visit  Medication Sig   buPROPion  (WELLBUTRIN  XL) 150 MG 24 hr tablet Take 1 tablet (150 mg total) by mouth daily. Take along with 300 mg tablet. Total of 450 mg daily.   buPROPion  (WELLBUTRIN  XL) 300 MG 24 hr tablet Take 1 tablet (300 mg total) by mouth daily. Take along with 150 mg tablet. Total of 450 mg daily.   hydrOXYzine  (ATARAX ) 10 MG tablet Take 1 tablet (10 mg total) by mouth daily as needed for anxiety.   medroxyPROGESTERone  (DEPO-PROVERA ) 150 MG/ML injection Inject 1 mL (150 mg total) into the muscle every 3 (three) months.   Omega-3 Fatty Acids (FISH OIL PO) Take by mouth.   Propylene Glycol (SYSTANE BALANCE) 0.6 % SOLN Apply 1 drop to eye as needed.   [DISCONTINUED] ibuprofen  (ADVIL ) 600 MG tablet Take 1 tablet (600 mg total) by mouth every 4 to 6 hours (Patient not taking: Reported on 03/22/2024)   [DISCONTINUED] naproxen  (NAPROSYN ) 500 MG tablet Take 1 tablet (500 mg total) by mouth 2 (two) times daily with a meal. (Patient not taking: Reported on 03/22/2024)   Facility-Administered Medications Prior to Visit  Medication Dose Route Frequency Provider   medroxyPROGESTERone  (DEPO-PROVERA ) injection 150 mg  150 mg Intramuscular Q90 days Stan Cantave, Connye Delaine, NP   medroxyPROGESTERone  (DEPO-PROVERA ) injection 150 mg  150 mg Intramuscular Q90 days Shalon Councilman, Connye Delaine, NP   medroxyPROGESTERone  (DEPO-PROVERA ) injection 150 mg  150 mg Intramuscular Q90 days Brihany Butch, Connye Delaine, NP   Review of Systems  Constitutional:  Negative for activity change, appetite change and unexpected weight change.  Respiratory:  Negative.    Cardiovascular: Negative.   Gastrointestinal: Negative.   Endocrine: Negative for cold intolerance and heat intolerance.  Genitourinary: Negative.   Musculoskeletal: Negative.   Skin: Negative.   Neurological: Negative.   Hematological: Negative.   Psychiatric/Behavioral:  Negative for behavioral problems, decreased concentration, dysphoric mood, hallucinations, self-injury, sleep disturbance and suicidal ideas. The patient is not nervous/anxious.         Objective:  BP 118/72 (BP Location: Left Arm, Patient Position: Sitting, Cuff Size: Normal)   Pulse 88   Temp 97.9 F (36.6 C) (Temporal)   Ht 5\' 10"  (1.778 m)   Wt 189 lb (85.7 kg)   SpO2 99%   BMI 27.12 kg/m     Physical Exam Vitals and nursing note  reviewed.  Constitutional:      General: She is not in acute distress. HENT:     Right Ear: Tympanic membrane, ear canal and external ear normal.     Left Ear: Tympanic membrane, ear canal and external ear normal.     Nose: Nose normal.  Eyes:     Extraocular Movements: Extraocular movements intact.     Conjunctiva/sclera: Conjunctivae normal.     Pupils: Pupils are equal, round, and reactive to light.  Neck:     Thyroid : No thyroid  mass, thyromegaly or thyroid  tenderness.  Cardiovascular:     Rate and Rhythm: Normal rate and regular rhythm.     Pulses: Normal pulses.     Heart sounds: Normal heart sounds.  Pulmonary:     Effort: Pulmonary effort is normal.     Breath sounds: Normal breath sounds.  Abdominal:     General: Bowel sounds are normal.     Palpations: Abdomen is soft.  Musculoskeletal:        General: Normal range of motion.     Cervical back: Normal range of motion and neck supple.     Right lower leg: No edema.     Left lower leg: No edema.  Lymphadenopathy:     Cervical: No cervical adenopathy.  Skin:    General: Skin is warm and dry.  Neurological:     Mental Status: She is alert and oriented to person, place, and time.      Cranial Nerves: No cranial nerve deficit.  Psychiatric:        Mood and Affect: Mood normal.        Behavior: Behavior normal.        Thought Content: Thought content normal.      No results found for any visits on 03/22/24.    Assessment & Plan:    Routine Health Maintenance and Physical Exam  Immunization History  Administered Date(s) Administered   DTP 07/02/1993   HIB, Unspecified 07/02/1993   Influenza, Seasonal, Injecte, Preservative Fre 07/26/2023   Influenza,inj,Quad PF,6+ Mos 09/08/2021, 08/03/2022   Influenza-Unspecified 09/08/2021, 08/03/2022   MMR 07/02/1993   Moderna Sars-Covid-2 Vaccination 03/04/2020, 04/01/2020   OPV 07/02/1993   Health Maintenance  Topic Date Due   COVID-19 Vaccine (3 - 2024-25 season) 04/07/2024 (Originally 07/31/2023)   INFLUENZA VACCINE  06/29/2024   Cervical Cancer Screening (HPV/Pap Cotest)  01/22/2027   Hepatitis C Screening  Completed   HIV Screening  Completed   HPV VACCINES  Aged Out   Meningococcal B Vaccine  Aged Out   DTaP/Tdap/Td  Discontinued   Discussed health benefits of physical activity, and encouraged her to engage in regular exercise appropriate for her age and condition.  Problem List Items Addressed This Visit     Encounter for Depo-Provera  contraception   Use of depo provera  since 02/2023. She is concerned about adverse effects with prolonged use: bone demineralization, DIABETES, cancer, heart disease, blindness.  Advised about need to maintain calcium, vit D, and weight bearing exercise to help maintain bone mass. She can switch contraception after 52yrs if she desires. We discussed her risk for breast cancer, DIABETES, and cardiovascular disease. She risk is not any higher compared to the general population. Advised about lack of correlation between blindness and use of depoprovera. She verbalized understanding and opted to continue injections      Other Visit Diagnoses       Preventative health care    -   Primary   Relevant Orders  Comprehensive metabolic panel with GFR   TSH     Encounter for lipid screening for cardiovascular disease       Relevant Orders   Lipid panel     Encounter for screening for viral disease       Relevant Orders   HIV Antibody (routine testing w rflx)   RPR   Urine cytology ancillary only   Hepatitis C antibody   Hep B Core Ab W/Reflex      Return in about 1 year (around 03/22/2025) for CPE (fasting).     Kathrene Parents, NP

## 2024-03-22 NOTE — Assessment & Plan Note (Addendum)
 Use of depo provera  since 02/2023. She is concerned about adverse effects with prolonged use: bone demineralization, DIABETES, cancer, heart disease, blindness.  Advised about need to maintain calcium, vit D, and weight bearing exercise to help maintain bone mass. She can switch contraception after 10yrs if she desires. We discussed her risk for breast cancer, DIABETES, and cardiovascular disease. She risk is not any higher compared to the general population. Advised about lack of correlation between blindness and use of depoprovera. She verbalized understanding and opted to continue injections

## 2024-03-23 ENCOUNTER — Encounter: Payer: Self-pay | Admitting: Nurse Practitioner

## 2024-03-23 LAB — HEPATITIS B CORE AB W/REFLEX: Hep B Core Total Ab: NEGATIVE

## 2024-03-23 LAB — HEPATITIS C ANTIBODY: Hepatitis C Ab: NONREACTIVE

## 2024-03-23 LAB — URINE CYTOLOGY ANCILLARY ONLY
Chlamydia: NEGATIVE
Comment: NEGATIVE
Comment: NEGATIVE
Comment: NORMAL
Neisseria Gonorrhea: NEGATIVE
Trichomonas: NEGATIVE

## 2024-03-23 LAB — HIV ANTIBODY (ROUTINE TESTING W REFLEX): HIV 1&2 Ab, 4th Generation: NONREACTIVE

## 2024-03-23 LAB — RPR: RPR Ser Ql: NONREACTIVE

## 2024-03-26 ENCOUNTER — Encounter: Payer: Self-pay | Admitting: Nurse Practitioner

## 2024-03-27 ENCOUNTER — Other Ambulatory Visit (HOSPITAL_COMMUNITY): Payer: Self-pay

## 2024-04-09 ENCOUNTER — Telehealth: Admitting: Physician Assistant

## 2024-04-09 ENCOUNTER — Other Ambulatory Visit (HOSPITAL_COMMUNITY): Payer: Self-pay

## 2024-04-09 DIAGNOSIS — R112 Nausea with vomiting, unspecified: Secondary | ICD-10-CM

## 2024-04-09 MED ORDER — ONDANSETRON 4 MG PO TBDP
4.0000 mg | ORAL_TABLET | Freq: Three times a day (TID) | ORAL | 0 refills | Status: DC | PRN
  Filled 2024-04-09: qty 20, 7d supply, fill #0

## 2024-04-09 NOTE — Progress Notes (Signed)

## 2024-04-20 ENCOUNTER — Other Ambulatory Visit (HOSPITAL_COMMUNITY): Payer: Self-pay
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
## 2024-04-26 ENCOUNTER — Telehealth: Admitting: Psychiatry

## 2024-04-26 ENCOUNTER — Encounter: Payer: Self-pay | Admitting: Psychiatry

## 2024-04-26 ENCOUNTER — Other Ambulatory Visit (HOSPITAL_COMMUNITY): Payer: Self-pay

## 2024-04-26 DIAGNOSIS — F411 Generalized anxiety disorder: Secondary | ICD-10-CM | POA: Diagnosis not present

## 2024-04-26 DIAGNOSIS — Z79899 Other long term (current) drug therapy: Secondary | ICD-10-CM | POA: Diagnosis not present

## 2024-04-26 DIAGNOSIS — F3341 Major depressive disorder, recurrent, in partial remission: Secondary | ICD-10-CM | POA: Diagnosis not present

## 2024-04-26 MED ORDER — BUPROPION HCL ER (XL) 300 MG PO TB24
300.0000 mg | ORAL_TABLET | Freq: Every day | ORAL | 5 refills | Status: AC
  Filled 2024-06-18: qty 30, 30d supply, fill #0
  Filled 2024-09-06: qty 30, 30d supply, fill #1
  Filled 2024-10-15: qty 30, 30d supply, fill #2
  Filled 2024-12-06: qty 30, 30d supply, fill #3

## 2024-04-26 MED ORDER — BUPROPION HCL ER (XL) 150 MG PO TB24
150.0000 mg | ORAL_TABLET | Freq: Every day | ORAL | 1 refills | Status: AC
  Filled 2024-06-18: qty 30, 30d supply, fill #0
  Filled 2024-09-06: qty 30, 30d supply, fill #1

## 2024-04-26 NOTE — Patient Instructions (Signed)
 Continue bupropion  450 mg daily  Hold topiramate  Continue hydroxyzine  10 mg daily as needed for anxiety  Next appointment: 7/23 at 3 pm

## 2024-04-26 NOTE — Addendum Note (Signed)
 Addended by: Amman Bartel on: 04/26/2024 05:20 PM   Modules accepted: Level of Service

## 2024-05-02 ENCOUNTER — Ambulatory Visit

## 2024-05-06 ENCOUNTER — Encounter: Payer: Self-pay | Admitting: Nurse Practitioner

## 2024-05-09 ENCOUNTER — Encounter: Admitting: Nurse Practitioner

## 2024-05-16 ENCOUNTER — Telehealth: Admitting: Physician Assistant

## 2024-05-16 ENCOUNTER — Other Ambulatory Visit (HOSPITAL_COMMUNITY): Payer: Self-pay

## 2024-05-16 DIAGNOSIS — A084 Viral intestinal infection, unspecified: Secondary | ICD-10-CM | POA: Diagnosis not present

## 2024-05-16 MED ORDER — ONDANSETRON 4 MG PO TBDP
4.0000 mg | ORAL_TABLET | Freq: Three times a day (TID) | ORAL | 0 refills | Status: AC | PRN
  Filled 2024-05-16: qty 20, 7d supply, fill #0

## 2024-05-16 NOTE — Progress Notes (Signed)
 I have spent 5 minutes in review of e-visit questionnaire, review and updating patient chart, medical decision making and response to patient.   Piedad Climes, PA-C

## 2024-05-16 NOTE — Progress Notes (Signed)
E-Visit for Nausea and Vomiting   We are sorry that you are not feeling well. Here is how we plan to help!  Based on what you have shared with me it looks like you have a Virus that is irritating your GI tract.  Vomiting is the forceful emptying of a portion of the stomach's content through the mouth.  Although nausea and vomiting can make you feel miserable, it's important to remember that these are not diseases, but rather symptoms of an underlying illness.  When we treat short term symptoms, we always caution that any symptoms that persist should be fully evaluated in a medical office.  I have prescribed a medication that will help alleviate your symptoms and allow you to stay hydrated:  Zofran 4 mg 1 tablet every 8 hours as needed for nausea and vomiting  HOME CARE: Drink clear liquids.  This is very important! Dehydration (the lack of fluid) can lead to a serious complication.  Start off with 1 tablespoon every 5 minutes for 8 hours. You may begin eating bland foods after 8 hours without vomiting.  Start with saltine crackers, white bread, rice, mashed potatoes, applesauce. After 48 hours on a bland diet, you may resume a normal diet. Try to go to sleep.  Sleep often empties the stomach and relieves the need to vomit.  GET HELP RIGHT AWAY IF:  Your symptoms do not improve or worsen within 2 days after treatment. You have a fever for over 3 days. You cannot keep down fluids after trying the medication.  MAKE SURE YOU:  Understand these instructions. Will watch your condition. Will get help right away if you are not doing well or get worse.    Thank you for choosing an e-visit.  Your e-visit answers were reviewed by a board certified advanced clinical practitioner to complete your personal care plan. Depending upon the condition, your plan could have included both over the counter or prescription medications.  Please review your pharmacy choice. Make sure the pharmacy is open so  you can pick up prescription now. If there is a problem, you may contact your provider through MyChart messaging and have the prescription routed to another pharmacy.  Your safety is important to us. If you have drug allergies check your prescription carefully.   For the next 24 hours you can use MyChart to ask questions about today's visit, request a non-urgent call back, or ask for a work or school excuse. You will get an email in the next two days asking about your experience. I hope that your e-visit has been valuable and will speed your recovery.  

## 2024-06-16 NOTE — Progress Notes (Unsigned)
 No show

## 2024-06-18 ENCOUNTER — Other Ambulatory Visit: Payer: Self-pay

## 2024-06-18 ENCOUNTER — Other Ambulatory Visit (HOSPITAL_COMMUNITY): Payer: Self-pay

## 2024-06-20 ENCOUNTER — Telehealth (INDEPENDENT_AMBULATORY_CARE_PROVIDER_SITE_OTHER): Payer: Self-pay | Admitting: Psychiatry

## 2024-06-20 DIAGNOSIS — Z91199 Patient's noncompliance with other medical treatment and regimen due to unspecified reason: Secondary | ICD-10-CM

## 2024-07-31 ENCOUNTER — Ambulatory Visit: Admitting: Nurse Practitioner

## 2024-08-01 ENCOUNTER — Encounter: Payer: Self-pay | Admitting: Nurse Practitioner

## 2024-10-15 ENCOUNTER — Telehealth: Payer: Self-pay | Admitting: Psychiatry

## 2024-10-15 ENCOUNTER — Other Ambulatory Visit: Payer: Self-pay

## 2024-10-15 ENCOUNTER — Other Ambulatory Visit: Payer: Self-pay | Admitting: Psychiatry

## 2024-10-15 NOTE — Telephone Encounter (Signed)
 Could you ask her to contact to have follow up if interested?

## 2024-10-15 NOTE — Telephone Encounter (Signed)
 Message left to reschedule appointment missed in July and if to continue care here

## 2024-12-06 ENCOUNTER — Other Ambulatory Visit: Payer: Self-pay | Admitting: Psychiatry

## 2024-12-06 ENCOUNTER — Other Ambulatory Visit: Payer: Self-pay

## 2024-12-24 ENCOUNTER — Telehealth: Admitting: Family Medicine

## 2024-12-24 DIAGNOSIS — N3000 Acute cystitis without hematuria: Secondary | ICD-10-CM

## 2024-12-24 MED ORDER — CEPHALEXIN 500 MG PO CAPS: 500.0000 mg | ORAL_CAPSULE | Freq: Two times a day (BID) | ORAL | 0 refills | Status: AC

## 2024-12-24 NOTE — Progress Notes (Signed)

## 2025-03-25 ENCOUNTER — Encounter: Admitting: Nurse Practitioner
